# Patient Record
Sex: Female | Born: 1955 | Race: White | Hispanic: No | Smoking: Former smoker
Health system: Southern US, Community
[De-identification: ages and names within clinical notes are randomized; demographics above are authoritative.]

## PROBLEM LIST (undated history)

## (undated) DIAGNOSIS — I2510 Atherosclerotic heart disease of native coronary artery without angina pectoris: Secondary | ICD-10-CM

## (undated) DIAGNOSIS — E785 Hyperlipidemia, unspecified: Secondary | ICD-10-CM

## (undated) DIAGNOSIS — K219 Gastro-esophageal reflux disease without esophagitis: Secondary | ICD-10-CM

## (undated) DIAGNOSIS — E669 Obesity, unspecified: Secondary | ICD-10-CM

## (undated) DIAGNOSIS — I10 Essential (primary) hypertension: Secondary | ICD-10-CM

## (undated) DIAGNOSIS — I219 Acute myocardial infarction, unspecified: Secondary | ICD-10-CM

## (undated) HISTORY — DX: Gastro-esophageal reflux disease without esophagitis: K21.9

## (undated) HISTORY — DX: Obesity, unspecified: E66.9

## (undated) HISTORY — DX: Atherosclerotic heart disease of native coronary artery without angina pectoris: I25.10

## (undated) HISTORY — DX: Essential (primary) hypertension: I10

## (undated) HISTORY — DX: Hyperlipidemia, unspecified: E78.5

## (undated) HISTORY — PX: FRACTURE SURGERY: SHX138

## (undated) HISTORY — DX: Acute myocardial infarction, unspecified: I21.9

---

## 2002-04-13 ENCOUNTER — Emergency Department (HOSPITAL_COMMUNITY): Admission: EM | Admit: 2002-04-13 | Discharge: 2002-04-14 | Payer: Self-pay | Admitting: *Deleted

## 2002-05-01 ENCOUNTER — Ambulatory Visit (HOSPITAL_COMMUNITY): Admission: RE | Admit: 2002-05-01 | Discharge: 2002-05-01 | Payer: Self-pay | Admitting: Orthopaedic Surgery

## 2002-05-01 ENCOUNTER — Encounter: Payer: Self-pay | Admitting: Orthopaedic Surgery

## 2003-05-19 DIAGNOSIS — I219 Acute myocardial infarction, unspecified: Secondary | ICD-10-CM

## 2003-05-19 HISTORY — PX: CARDIAC CATHETERIZATION: SHX172

## 2003-05-19 HISTORY — DX: Acute myocardial infarction, unspecified: I21.9

## 2004-05-14 ENCOUNTER — Encounter (HOSPITAL_COMMUNITY): Admission: RE | Admit: 2004-05-14 | Discharge: 2004-06-13 | Payer: Self-pay | Admitting: *Deleted

## 2004-09-26 ENCOUNTER — Ambulatory Visit (HOSPITAL_COMMUNITY): Admission: RE | Admit: 2004-09-26 | Discharge: 2004-09-26 | Payer: Self-pay | Admitting: *Deleted

## 2006-09-22 ENCOUNTER — Encounter: Admission: RE | Admit: 2006-09-22 | Discharge: 2006-09-22 | Payer: Self-pay | Admitting: Family Medicine

## 2010-02-10 ENCOUNTER — Ambulatory Visit (HOSPITAL_COMMUNITY): Admission: RE | Admit: 2010-02-10 | Discharge: 2010-02-10 | Payer: Self-pay | Admitting: Cardiology

## 2010-02-12 ENCOUNTER — Ambulatory Visit (HOSPITAL_COMMUNITY): Admission: RE | Admit: 2010-02-12 | Discharge: 2010-02-12 | Payer: Self-pay | Admitting: Cardiology

## 2010-08-27 ENCOUNTER — Ambulatory Visit (HOSPITAL_COMMUNITY)
Admission: RE | Admit: 2010-08-27 | Discharge: 2010-08-27 | Disposition: A | Discharge status: Home / Self Care | Payer: Managed Care, Other (non HMO) | Source: Ambulatory Visit | Attending: Family Medicine | Admitting: Family Medicine

## 2010-08-27 ENCOUNTER — Other Ambulatory Visit (HOSPITAL_COMMUNITY): Payer: Self-pay | Admitting: Family Medicine

## 2010-08-27 DIAGNOSIS — M79609 Pain in unspecified limb: Secondary | ICD-10-CM | POA: Insufficient documentation

## 2010-08-27 DIAGNOSIS — M7989 Other specified soft tissue disorders: Secondary | ICD-10-CM | POA: Insufficient documentation

## 2010-08-27 DIAGNOSIS — M79605 Pain in left leg: Secondary | ICD-10-CM

## 2011-01-05 ENCOUNTER — Other Ambulatory Visit: Payer: Self-pay | Admitting: Family Medicine

## 2011-01-05 DIAGNOSIS — R922 Inconclusive mammogram: Secondary | ICD-10-CM

## 2011-01-12 ENCOUNTER — Other Ambulatory Visit: Payer: Managed Care, Other (non HMO)

## 2011-01-26 ENCOUNTER — Ambulatory Visit
Admission: RE | Admit: 2011-01-26 | Discharge: 2011-01-26 | Disposition: A | Discharge status: Home / Self Care | Payer: Managed Care, Other (non HMO) | Source: Ambulatory Visit | Attending: Family Medicine | Admitting: Family Medicine

## 2011-01-26 DIAGNOSIS — R922 Inconclusive mammogram: Secondary | ICD-10-CM

## 2011-02-02 ENCOUNTER — Other Ambulatory Visit: Payer: Self-pay | Admitting: Obstetrics & Gynecology

## 2011-02-02 ENCOUNTER — Other Ambulatory Visit (HOSPITAL_COMMUNITY)
Admission: RE | Admit: 2011-02-02 | Discharge: 2011-02-02 | Disposition: A | Discharge status: Home / Self Care | Payer: Managed Care, Other (non HMO) | Source: Ambulatory Visit | Attending: Obstetrics & Gynecology | Admitting: Obstetrics & Gynecology

## 2011-02-02 DIAGNOSIS — Z113 Encounter for screening for infections with a predominantly sexual mode of transmission: Secondary | ICD-10-CM | POA: Insufficient documentation

## 2011-02-02 DIAGNOSIS — Z01419 Encounter for gynecological examination (general) (routine) without abnormal findings: Secondary | ICD-10-CM | POA: Insufficient documentation

## 2012-05-23 ENCOUNTER — Telehealth: Payer: Self-pay

## 2012-05-23 NOTE — Telephone Encounter (Signed)
Gastroenterology Pre-Procedure Form  Triaged by Amanda Cockayne, CMA  Request Date: 05/20/2012      Requesting Physician: Dr. Sherwood Gambler     PATIENT INFORMATION:  Maria Robertson is a 57 y.o., female (DOB=1955/11/29).  PROCEDURE: Procedure(s) requested: colonoscopy Procedure Reason: screening for colon cancer  PATIENT REVIEW QUESTIONS: The patient reports the following:   1. Diabetes Melitis: no 2. Joint replacements in the past 12 months: no 3. Major health problems in the past 3 months: no 4. Has an artificial valve or MVP: YES 5. Has been advised in past to take antibiotics in advance of a procedure like teeth cleaning: YES    MEDICATIONS & ALLERGIES:    Patient reports the following regarding taking any blood thinners:   Plavix? yes  Aspirin?yes  Coumadin?  no  Patient confirms/reports the following medications:  Current Outpatient Prescriptions  Medication Sig Dispense Refill  . aspirin 81 MG tablet Take 81 mg by mouth daily.      Marland Kitchen atorvastatin (LIPITOR) 20 MG tablet Take 20 mg by mouth daily.      . clopidogrel (PLAVIX) 75 MG tablet Take 75 mg by mouth daily.      . nebivolol (BYSTOLIC) 5 MG tablet Take 5 mg by mouth daily.      Marland Kitchen omeprazole (PRILOSEC) 20 MG capsule Take 20 mg by mouth daily.        Patient confirms/reports the following allergies:  No Known Allergies  Patient is appropriate to schedule for requested procedure(s): yes  AUTHORIZATION INFORMATION Primary Insurance: ,  ID #: ,  Group #:  Pre-Cert / Auth required:  Secondary Insurance:   ID #:   Group #:  Pre-Cert / Auth required:  Pre-Cert / Auth #:   No orders of the defined types were placed in this encounter.    SCHEDULE INFORMATION: Procedure has been scheduled as follows:  Date:                    Time:  Location:   This Gastroenterology Pre-Precedure Form is being routed to the following provider(s) for review: Jonette Eva, MD

## 2012-05-25 NOTE — Telephone Encounter (Signed)
Called pt. She is not feeling well today. Still wants to call her insurance co first and she will call back to schedule.

## 2012-05-25 NOTE — Telephone Encounter (Signed)
MOVI PREP SPLIT DOSING, REGULAR BREAKFAST. CLEAR LIQUIDS AFTER 9 AM.  

## 2012-05-31 NOTE — Telephone Encounter (Signed)
LMOM to call. Mailing a letter also to call.  

## 2012-09-23 ENCOUNTER — Encounter: Payer: Self-pay | Admitting: Internal Medicine

## 2013-02-02 ENCOUNTER — Other Ambulatory Visit: Payer: Self-pay | Admitting: Internal Medicine

## 2013-02-03 NOTE — Telephone Encounter (Signed)
Rx was sent to pharmacy electronically. 

## 2013-03-27 ENCOUNTER — Other Ambulatory Visit: Payer: Self-pay | Admitting: *Deleted

## 2013-03-30 ENCOUNTER — Ambulatory Visit (INDEPENDENT_AMBULATORY_CARE_PROVIDER_SITE_OTHER): Payer: Managed Care, Other (non HMO) | Admitting: Internal Medicine

## 2013-03-30 ENCOUNTER — Encounter: Payer: Self-pay | Admitting: Internal Medicine

## 2013-03-30 VITALS — BP 120/70 | HR 63 | Ht 59.0 in | Wt 174.4 lb

## 2013-03-30 DIAGNOSIS — E785 Hyperlipidemia, unspecified: Secondary | ICD-10-CM

## 2013-03-30 DIAGNOSIS — K219 Gastro-esophageal reflux disease without esophagitis: Secondary | ICD-10-CM

## 2013-03-30 DIAGNOSIS — I2510 Atherosclerotic heart disease of native coronary artery without angina pectoris: Secondary | ICD-10-CM

## 2013-03-30 DIAGNOSIS — E669 Obesity, unspecified: Secondary | ICD-10-CM

## 2013-03-30 MED ORDER — CLOPIDOGREL BISULFATE 75 MG PO TABS: 75.0000 mg | ORAL_TABLET | Freq: Every day | ORAL | Status: DC

## 2013-03-30 MED ORDER — ATORVASTATIN CALCIUM 20 MG PO TABS: 20.0000 mg | ORAL_TABLET | Freq: Every day | ORAL | Status: DC

## 2013-03-30 MED ORDER — NEBIVOLOL HCL 10 MG PO TABS: 10.0000 mg | ORAL_TABLET | Freq: Every day | ORAL | Status: DC

## 2013-03-30 MED ORDER — NITROGLYCERIN 0.4 MG SL SUBL: 0.4000 mg | SUBLINGUAL_TABLET | SUBLINGUAL | Status: DC | PRN

## 2013-03-30 NOTE — Patient Instructions (Addendum)
Please have blood work done today. We will call you with the results.  Your physician wants you to follow-up in: 1 year. You will receive a reminder letter in the mail two months in advance. If you don't receive a letter, please call our office to schedule the follow-up appointment.

## 2013-03-31 ENCOUNTER — Encounter: Payer: Self-pay | Admitting: Internal Medicine

## 2013-03-31 DIAGNOSIS — K219 Gastro-esophageal reflux disease without esophagitis: Secondary | ICD-10-CM | POA: Insufficient documentation

## 2013-03-31 DIAGNOSIS — E669 Obesity, unspecified: Secondary | ICD-10-CM | POA: Insufficient documentation

## 2013-03-31 DIAGNOSIS — E785 Hyperlipidemia, unspecified: Secondary | ICD-10-CM | POA: Insufficient documentation

## 2013-03-31 DIAGNOSIS — I2510 Atherosclerotic heart disease of native coronary artery without angina pectoris: Secondary | ICD-10-CM | POA: Insufficient documentation

## 2013-03-31 LAB — NMR LIPOPROFILE WITH LIPIDS
HDL Particle Number: 43 umol/L (ref >=30.5)
HDL Size: 9.3 nm (ref >=9.2)
HDL-C: 66 mg/dL (ref >=40)
LDL Size: 21 nm (ref >20.5)
Large HDL-P: 9.9 umol/L (ref >=4.8)
Large VLDL-P: 4.9 nmol/L — ABNORMAL HIGH (ref <=2.7)
Small LDL Particle Number: 330 nmol/L (ref <=527)

## 2013-03-31 NOTE — Progress Notes (Signed)
OFFICE NOTE  Chief Complaint:  Routine follow-up  Primary Care Physician: Kirk Ruths, MD  HPI:  Maria Robertson is a 57 year old female who was previously followed by Dr. Lynnea Robertson with a history of myocardial infarction and a stent to the left circumflex in 10/25/03 which was a 2.5 x 8 mm Cypher stent. She underwent cardiac catheterization again in 10/24/2009 after the death of her husband who died suddenly of heart disease, and she was having chest pain at that time. This demonstrated no other significant coronary disease, and the stent to the circumflex was patent. She also has dyslipidemia, reflux, and obesity. She has no specific complaints today. She's any refills of her medications. She has again found work at a [employer], [employer]. She has had some weight gain and realizes that she needs to start to get more exercise.  PMHx:  Past Medical History  Diagnosis Date  . Myocardial infarction 10-25-03  . Coronary artery disease   . Dyslipidemia   . GERD (gastroesophageal reflux disease)     Past Surgical History  Procedure Laterality Date  . Cardiac catheterization  25-Oct-2003    showed 80% stenosis in left circumflex, 2.5x9mm Taxus drug-eluting stent   . Fracture surgery      right foot    FAMHx:  Family History  Problem Relation Age of Onset  . Heart disease Father   . Diabetes Mother   . Hypertension Mother     SOCHx:   reports that she quit smoking about 3 years ago. Her smoking use included Cigarettes. She smoked 0.00 packs per day for 20 years. She has never used smokeless tobacco. She reports that she does not drink alcohol or use illicit drugs.  ALLERGIES:  No Known Allergies  ROS: A comprehensive review of systems was negative except for: Constitutional: positive for weight gain  HOME MEDS: Current Outpatient Prescriptions  Medication Sig Dispense Refill  . aspirin 81 MG tablet Take 81 mg by mouth daily.      Marland Kitchen atorvastatin (LIPITOR) 20 MG tablet  Take 1 tablet (20 mg total) by mouth at bedtime.  90 tablet  3  . clopidogrel (PLAVIX) 75 MG tablet Take 1 tablet (75 mg total) by mouth daily.  90 tablet  3  . nebivolol (BYSTOLIC) 10 MG tablet Take 1 tablet (10 mg total) by mouth daily.  90 tablet  3  . nitroGLYCERIN (NITROSTAT) 0.4 MG SL tablet Place 1 tablet (0.4 mg total) under the tongue every 5 (five) minutes as needed for chest pain.  25 tablet  3  . omeprazole (PRILOSEC) 20 MG capsule Take 20 mg by mouth daily.       No current facility-administered medications for this visit.    LABS/IMAGING: No results found for this or any previous visit (from the past 48 hour(s)). No results found.  VITALS: BP 120/70  Pulse 63  Ht 4\' 11"  (1.499 m)  Wt 174 lb 6.4 oz (79.107 kg)  BMI 35.21 kg/m2  EXAM: General appearance: alert and no distress Neck: no carotid bruit and no JVD Lungs: clear to auscultation bilaterally Heart: regular rate and rhythm, S1, S2 normal, no murmur, click, rub or gallop Abdomen: soft, non-tender; bowel sounds normal; no masses,  no organomegaly Extremities: extremities normal, atraumatic, no cyanosis or edema Pulses: 2+ and symmetric Skin: Skin color, texture, turgor normal. No rashes or lesions Neurologic: Grossly normal Psych: Mood, affect normal  EKG: Normal sinus rhythm at 63  ASSESSMENT: 1. Coronary artery disease  status post PCI to left circumflex in 2005 2. Dyslipidemia - due for testing 3. GERD 4. Obesity  PLAN: 1.   Maria Robertson has had about a 15 pound weight gain since her last office visit. She knows she needs to work on weight loss and exercise. Dietary changes would be important as well. She's had no chest pain and is able to do most activities without any impairment. She is due for repeat cholesterol testing and will order that today. Plan to see her back annually or sooner as necessary and I will adjust her medications as needed.  Chrystie Nose, MD, [facility] Attending Cardiologist [facility]  Maria Robertson C 03/31/2013, 10:59 AM

## 2013-04-07 ENCOUNTER — Encounter: Payer: Self-pay | Admitting: *Deleted

## 2014-03-28 ENCOUNTER — Telehealth: Payer: Self-pay | Admitting: Internal Medicine

## 2014-03-30 NOTE — Telephone Encounter (Signed)
Close encounter 

## 2014-04-24 ENCOUNTER — Encounter: Payer: Self-pay | Admitting: Internal Medicine

## 2014-04-24 ENCOUNTER — Ambulatory Visit (INDEPENDENT_AMBULATORY_CARE_PROVIDER_SITE_OTHER): Payer: Medicare HMO | Admitting: Internal Medicine

## 2014-04-24 VITALS — BP 142/84 | HR 74 | Ht 59.0 in | Wt 182.8 lb

## 2014-04-24 DIAGNOSIS — I2510 Atherosclerotic heart disease of native coronary artery without angina pectoris: Secondary | ICD-10-CM

## 2014-04-24 DIAGNOSIS — E669 Obesity, unspecified: Secondary | ICD-10-CM

## 2014-04-24 DIAGNOSIS — I208 Other forms of angina pectoris: Secondary | ICD-10-CM

## 2014-04-24 DIAGNOSIS — K219 Gastro-esophageal reflux disease without esophagitis: Secondary | ICD-10-CM

## 2014-04-24 DIAGNOSIS — E785 Hyperlipidemia, unspecified: Secondary | ICD-10-CM

## 2014-04-24 DIAGNOSIS — I2089 Other forms of angina pectoris: Secondary | ICD-10-CM

## 2014-04-24 DIAGNOSIS — I2583 Coronary atherosclerosis due to lipid rich plaque: Secondary | ICD-10-CM

## 2014-04-24 NOTE — Patient Instructions (Signed)
Your physician has requested that you have an exercise tolerance test. For further information please visit [email or web address]. Please also follow instruction sheet, as given.  Your physician wants you to follow-up in: 1 year with Dr. Hilty. You will receive a reminder letter in the mail two months in advance. If you don't receive a letter, please call our office to schedule the follow-up appointment.  

## 2014-04-24 NOTE — Progress Notes (Signed)
OFFICE NOTE  Chief Complaint:  Routine follow-up  Primary Care Physician: Kirk RuthsMCGOUGH,WILLIAM M, MD  HPI:  Maria Robertson is a 58 year old female who was previously followed by Dr. Lynnea FerrierSolomon with a history of myocardial infarction and a stent to the left circumflex in 2005 which was a 2.5 x 8 mm Cypher stent. She underwent cardiac catheterization again in 2011 after the death of her husband who died suddenly of heart disease, and she was having chest pain at that time. This demonstrated no other significant coronary disease, and the stent to the circumflex was patent. She also has dyslipidemia, reflux, and obesity. She has no specific complaints today. She's any refills of her medications. 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 She has had some weight gain and realizes that she needs to start to get more exercise.  Maria Robertson returns for follow-up today. [place]. She denies any chest pain or worsening shortness of breath. Blood pressures been running a little bit higher than usual. She has unfortunate gained another 5 pounds which makes about 20 pound weight gain over the last year. She realizes this and is going to get started on weight loss. She's had her cholesterol tested through her primary care physician and is told that it is within normal limits. Finally when pressed about physical activity she reports she does have a little bit of chest pressure with marked activity, especially when walking up hills. She says this limits her activity to some extent and she discontinues her walking. This pressure does not radiate to her neck or arm as it did previously prior to her heart attack but is somewhat reminiscent of those symptoms. She is interested in starting a more intense exercise program.  PMHx:  Past Medical History  Diagnosis Date  . Myocardial infarction 2005  . Coronary artery disease   . Dyslipidemia   . GERD  (gastroesophageal reflux disease)     Past Surgical History  Procedure Laterality Date  . Cardiac catheterization  2005    showed 80% stenosis in left circumflex, 2.5x628mm Taxus drug-eluting stent   . Fracture surgery      right foot    FAMHx:  Family History  Problem Relation Age of Onset  . Heart disease Father   . Diabetes Mother   . Hypertension Mother     SOCHx:   reports that she quit smoking about 4 years ago. Her smoking use included Cigarettes. She smoked 0.00 packs per day for 20 years. She has never used smokeless tobacco. She reports that she does not drink alcohol or use illicit drugs.  ALLERGIES:  No Known Allergies  ROS: A comprehensive review of systems was negative except for: Constitutional: positive for weight gain  HOME MEDS: Current Outpatient Prescriptions  Medication Sig Dispense Refill  . aspirin 81 MG tablet Take 81 mg by mouth daily.    Marland Kitchen. atorvastatin (LIPITOR) 20 MG tablet Take 1 tablet (20 mg total) by mouth at bedtime. 90 tablet 3  . clopidogrel (PLAVIX) 75 MG tablet Take 1 tablet (75 mg total) by mouth daily. 90 tablet 3  . nebivolol (BYSTOLIC) 10 MG tablet Take 1 tablet (10 mg total) by mouth daily. 90 tablet 3  . nitroGLYCERIN (NITROSTAT) 0.4 MG SL tablet Place 1 tablet (0.4 mg total) under the tongue every 5 (five) minutes as needed for chest pain. 25 tablet 3  . omeprazole (PRILOSEC) 20 MG capsule Take 20 mg  by mouth daily.     No current facility-administered medications for this visit.    LABS/IMAGING: No results found for this or any previous visit (from the past 48 hour(s)). No results found.  VITALS: BP 142/84 mmHg  Pulse 74  Ht 4\' 11"  (1.499 m)  Wt 182 lb 12.8 oz (82.918 kg)  BMI 36.90 kg/m2  EXAM: General appearance: alert and no distress Neck: no carotid bruit and no JVD Lungs: clear to auscultation bilaterally Heart: regular rate and rhythm, S1, S2 normal, no murmur, click, rub or gallop Abdomen: soft, non-tender;  bowel sounds normal; no masses,  no organomegaly Extremities: extremities normal, atraumatic, no cyanosis or edema Pulses: 2+ and symmetric Skin: Skin color, texture, turgor normal. No rashes or lesions Neurologic: Grossly normal Psych: Mood, affect normal  EKG: Normal sinus rhythm at 74  ASSESSMENT: 1. Coronary artery disease status post PCI to left circumflex in 2005 2. Dyslipidemia - due for testing 3. GERD 4. Obesity 5. Exertional chest pressure  PLAN: 1.   Maria Robertson has had another 5 pound weight gain. This may be contributing to her shortness of breath, but she also is describing some exertional chest pressure. It seems to be when walking up hills or doing significant exertion which she discontinues and her symptoms improved. This could represent angina. Her last stress test was many years ago and she had a cardiac cardiac catheterization 2011 which did not show any acute obstruction. I would recommend an exercise treadmill stress test to evaluate for her symptoms and for angina. If this is negative, would be helpful and prognostic as well as allow her to start a more vigorous exercise program. We will contact her with results of that treadmill stress test and plan to see her back annually or sooner if it is abnormal.  Chrystie NoseKenneth C. Hilty, MD, [facility] Attending Cardiologist [facility]  HILTY,Kenneth C 04/24/2014, 4:06 PM

## 2014-05-01 ENCOUNTER — Telehealth (HOSPITAL_COMMUNITY): Payer: Self-pay

## 2014-05-01 NOTE — Telephone Encounter (Signed)
Encounter complete. 

## 2014-05-03 ENCOUNTER — Encounter (HOSPITAL_COMMUNITY): Payer: Medicare HMO

## 2014-05-03 ENCOUNTER — Other Ambulatory Visit: Payer: Self-pay | Admitting: Internal Medicine

## 2014-05-03 NOTE — Telephone Encounter (Signed)
Rx was sent to pharmacy electronically. 

## 2014-05-03 NOTE — Telephone Encounter (Signed)
Encounter complete. 

## 2014-05-07 ENCOUNTER — Other Ambulatory Visit: Payer: Self-pay | Admitting: Internal Medicine

## 2014-05-07 NOTE — Telephone Encounter (Signed)
Rx(s) sent to pharmacy electronically.  

## 2014-05-29 ENCOUNTER — Telehealth (HOSPITAL_COMMUNITY): Payer: Self-pay

## 2014-05-29 NOTE — Telephone Encounter (Signed)
Encounter complete. 

## 2014-05-30 ENCOUNTER — Telehealth (HOSPITAL_COMMUNITY): Payer: Self-pay

## 2014-05-30 NOTE — Telephone Encounter (Signed)
Encounter complete. 

## 2014-05-31 ENCOUNTER — Ambulatory Visit (HOSPITAL_COMMUNITY)
Admission: RE | Admit: 2014-05-31 | Discharge: 2014-05-31 | Disposition: A | Discharge status: Home / Self Care | Payer: Medicare HMO | Source: Ambulatory Visit | Attending: Internal Medicine | Admitting: Internal Medicine

## 2014-05-31 DIAGNOSIS — I208 Other forms of angina pectoris: Secondary | ICD-10-CM

## 2014-05-31 DIAGNOSIS — R079 Chest pain, unspecified: Secondary | ICD-10-CM | POA: Diagnosis not present

## 2014-05-31 NOTE — Procedures (Signed)
Exercise Treadmill Test   Test  Exercise Tolerance Test Ordering MD: Chrystie NoseKenneth C. Cloteal Isaacson, MD    Unique Test No: 1  Treadmill:  1  Indication for ETT: exertional dyspnea  Contraindication to ETT: No   Stress Modality: exercise - treadmill  Cardiac Imaging Performed: non   Protocol: standard Bruce - maximal  Max BP:  149/69  Max MPHR (bpm):  162 85% MPR (bpm):  138  MPHR obtained (bpm):  144 % MPHR obtained:  88  Reached 85% MPHR (min:sec):   Total Exercise Time (min-sec):  7  Workload in METS:  8.5 Borg Scale: 15  Reason ETT Terminated:  dyspnea    ST Segment Analysis At Rest: normal ST segments - no evidence of significant ST depression With Exercise: insignificant upsloping ST depression  Other Information Arrhythmia:  No Angina during ETT:  absent (0) Quality of ETT:  diagnostic  ETT Interpretation:  normal - no evidence of ischemia by ST analysis  Comments: <551mm upsloping ST depression inferiorly Fair exercise tolerance Normal BP response No chest pain, but significant dyspnea  Chrystie NoseKenneth C. Manila Rommel, MD, [facility] Attending Cardiologist [facility]

## 2015-06-14 ENCOUNTER — Other Ambulatory Visit: Payer: Self-pay | Admitting: Internal Medicine

## 2015-06-18 ENCOUNTER — Telehealth: Payer: Self-pay | Admitting: Internal Medicine

## 2015-06-18 NOTE — Telephone Encounter (Signed)
°*  STAT* If patient is at the pharmacy, call can be transferred to refill team.   1. Which medications need to be refilled? (please list name of each medication and dose if known) All meds   2. Which pharmacy/location (including street and city if local pharmacy) is medication to be sent to?[facility] on [address]   3. Do they need a 30 day or 90 day supply? 90

## 2015-06-19 MED ORDER — NEBIVOLOL HCL 10 MG PO TABS: 10.0000 mg | ORAL_TABLET | Freq: Every day | ORAL | Status: DC

## 2015-06-19 MED ORDER — CLOPIDOGREL BISULFATE 75 MG PO TABS: 75.0000 mg | ORAL_TABLET | Freq: Every day | ORAL | Status: DC

## 2015-06-19 MED ORDER — ATORVASTATIN CALCIUM 20 MG PO TABS: 20.0000 mg | ORAL_TABLET | Freq: Every day | ORAL | Status: DC

## 2015-06-19 NOTE — Telephone Encounter (Signed)
Rx(s) sent to pharmacy electronically.  

## 2015-07-01 ENCOUNTER — Encounter: Payer: Self-pay | Admitting: Internal Medicine

## 2015-07-01 ENCOUNTER — Ambulatory Visit (INDEPENDENT_AMBULATORY_CARE_PROVIDER_SITE_OTHER): Payer: 59 | Admitting: Internal Medicine

## 2015-07-01 VITALS — BP 120/74 | HR 56 | Ht <= 58 in | Wt 183.1 lb

## 2015-07-01 DIAGNOSIS — I2510 Atherosclerotic heart disease of native coronary artery without angina pectoris: Secondary | ICD-10-CM | POA: Diagnosis not present

## 2015-07-01 DIAGNOSIS — K219 Gastro-esophageal reflux disease without esophagitis: Secondary | ICD-10-CM | POA: Diagnosis not present

## 2015-07-01 DIAGNOSIS — E669 Obesity, unspecified: Secondary | ICD-10-CM | POA: Diagnosis not present

## 2015-07-01 DIAGNOSIS — E785 Hyperlipidemia, unspecified: Secondary | ICD-10-CM

## 2015-07-01 DIAGNOSIS — I2583 Coronary atherosclerosis due to lipid rich plaque: Principal | ICD-10-CM

## 2015-07-01 MED ORDER — CLOPIDOGREL BISULFATE 75 MG PO TABS: 75.0000 mg | ORAL_TABLET | Freq: Every day | ORAL | Status: DC

## 2015-07-01 MED ORDER — NEBIVOLOL HCL 10 MG PO TABS: 10.0000 mg | ORAL_TABLET | Freq: Every day | ORAL | Status: DC

## 2015-07-01 MED ORDER — ATORVASTATIN CALCIUM 20 MG PO TABS: 20.0000 mg | ORAL_TABLET | Freq: Every day | ORAL | Status: DC

## 2015-07-01 NOTE — Progress Notes (Signed)
OFFICE NOTE  Chief Complaint:  Routine follow-up  Primary Care Physician: Maria Ruths, MD  HPI:  Maria Robertson is a 60 year old female who was previously followed by Dr. Lynnea Robertson with a history of myocardial infarction and a stent to the left circumflex in 09-25-2003 which was a 2.5 x 8 mm Cypher stent. She underwent cardiac catheterization again in 2009-09-24 after the death of her husband who died suddenly of heart disease, and she was having chest pain at that time. This demonstrated no other significant coronary disease, and the stent to the circumflex was patent. She also has dyslipidemia, reflux, and obesity. She has no specific complaints today. She's any refills of her medications. She has again found work at a [employer], [employer]. She has had some weight gain and realizes that she needs to start to get more exercise.  Maria Robertson returns for follow-up today. Recently she was on vacation in [place]. She denies any chest pain or worsening shortness of breath. Blood pressures been running a little bit higher than usual. She has unfortunate gained another 5 pounds which makes about 20 pound weight gain over the last year. She realizes this and is going to get started on weight loss. She's had her cholesterol tested through her primary care physician and is told that it is within normal limits. Finally when pressed about physical activity she reports she does have a little bit of chest pressure with marked activity, especially when walking up hills. She says this limits her activity to some extent and she discontinues her walking. This pressure does not radiate to her neck or arm as it did previously prior to her heart attack but is somewhat reminiscent of those symptoms. She is interested in starting a more intense exercise program.  Maria Robertson returns today for follow-up. She is asymptomatic and denies any chest pain or worsening shortness breath. Her blood pressure is  actually well controlled today. She says the cost of her nebivolol has gone up significantly. We'll try to provide her with the co-pay assistance card and samples today. She is on cholesterol medicine and recently had labs checked by her primary care provider. We'll need to get those results. She denies a significant bleeding problems on aspirin and Plavix. She's not needed any nitroglycerin. She denies any chest pain. EKG is stable.  PMHx:  Past Medical History  Diagnosis Date  . Myocardial infarction (HCC) 09/25/03  . Coronary artery disease   . Dyslipidemia   . GERD (gastroesophageal reflux disease)     Past Surgical History  Procedure Laterality Date  . Cardiac catheterization  09/25/2003    showed 80% stenosis in left circumflex, 2.5x80mm Taxus drug-eluting stent   . Fracture surgery      right foot    FAMHx:  Family History  Problem Relation Age of Onset  . Heart disease Father   . Diabetes Mother   . Hypertension Mother     SOCHx:   reports that she quit smoking about 5 years ago. Her smoking use included Cigarettes. She quit after 20 years of use. She has never used smokeless tobacco. She reports that she does not drink alcohol or use illicit drugs.  ALLERGIES:  No Known Allergies  ROS: Pertinent items noted in HPI and remainder of comprehensive ROS otherwise negative.  HOME MEDS: Current Outpatient Prescriptions  Medication Sig Dispense Refill  . aspirin 81 MG tablet Take 81 mg by mouth daily.    Marland Kitchen atorvastatin (LIPITOR) 20  MG tablet Take 1 tablet (20 mg total) by mouth at bedtime. 90 tablet 3  . clopidogrel (PLAVIX) 75 MG tablet Take 1 tablet (75 mg total) by mouth daily. 90 tablet 3  . nebivolol (BYSTOLIC) 10 MG tablet Take 1 tablet (10 mg total) by mouth daily. 90 tablet 3  . NITROSTAT 0.4 MG SL tablet PLACE 1 TABLET (0.4 MG TOTAL) UNDER THE TONGUE EVERY 5 (FIVE) MINUTES AS NEEDED FOR CHEST PAIN. 25 tablet 1  . omeprazole (PRILOSEC) 20 MG capsule Take 20 mg by mouth  daily.     No current facility-administered medications for this visit.    LABS/IMAGING: No results found for this or any previous visit (from the past 48 hour(s)). No results found.  VITALS: BP 120/74 mmHg  Pulse 56  Ht  (1.473 m)  Wt 183 lb 1.6 oz (83.054 kg)  BMI 38.28 kg/m2  EXAM: General appearance: alert and no distress Neck: no carotid bruit and no JVD Lungs: clear to auscultation bilaterally Heart: regular rate and rhythm, S1, S2 normal, no murmur, click, rub or gallop Abdomen: soft, non-tender; bowel sounds normal; no masses,  no organomegaly Extremities: extremities normal, atraumatic, no cyanosis or edema Pulses: 2+ and symmetric Skin: Skin color, texture, turgor normal. No rashes or lesions Neurologic: Grossly normal Psych: Mood, affect normal  EKG: Sinus bradycardia 56  ASSESSMENT: 1. Coronary artery disease status post PCI to left circumflex in 2005 2. Dyslipidemia 3. GERD 4. Obesity  PLAN: 1.   Maria Robertson seems stable on her current regimen. Her blood pressure is better today. She needs refills of nebivolol and hopefully a co-pay card. Cholesterol is reportedly stable and will obtain those results. She is on Lipitor 20. She is on dual antiplatelet therapy which was started prior to me assuming her care. She seems to have done well with this long-term has not had bleeding problems. We'll continue this combination. Plan to see her back annually or sooner as necessary.  Maria Nose, MD, [facility] Attending Cardiologist [facility]  Maria Robertson 07/01/2015, 5:15 PM

## 2015-07-01 NOTE — Patient Instructions (Addendum)
Your physician wants you to follow-up in: 1 year with Dr. Rennis Golden. You will receive a reminder letter in the mail two months in advance. If you don't receive a letter, please call our office to schedule the follow-up appointment.  bystolic samples - 2 boxes given to patient + copay card  Call to PCP office - LM to get labs

## 2015-08-15 ENCOUNTER — Other Ambulatory Visit: Payer: Self-pay | Admitting: Family Medicine

## 2015-08-15 DIAGNOSIS — Z1231 Encounter for screening mammogram for malignant neoplasm of breast: Secondary | ICD-10-CM

## 2015-10-25 ENCOUNTER — Ambulatory Visit
Admission: RE | Admit: 2015-10-25 | Discharge: 2015-10-25 | Disposition: A | Discharge status: Home / Self Care | Payer: 59 | Source: Ambulatory Visit | Attending: Family Medicine | Admitting: Family Medicine

## 2015-10-25 DIAGNOSIS — Z1231 Encounter for screening mammogram for malignant neoplasm of breast: Secondary | ICD-10-CM

## 2016-05-28 DIAGNOSIS — S8982XA Other specified injuries of left lower leg, initial encounter: Secondary | ICD-10-CM | POA: Diagnosis not present

## 2016-05-28 DIAGNOSIS — M7052 Other bursitis of knee, left knee: Secondary | ICD-10-CM | POA: Diagnosis not present

## 2016-05-28 DIAGNOSIS — M1712 Unilateral primary osteoarthritis, left knee: Secondary | ICD-10-CM | POA: Diagnosis not present

## 2016-06-05 DIAGNOSIS — S8982XA Other specified injuries of left lower leg, initial encounter: Secondary | ICD-10-CM | POA: Diagnosis not present

## 2016-06-10 DIAGNOSIS — M84362A Stress fracture, left tibia, initial encounter for fracture: Secondary | ICD-10-CM | POA: Diagnosis not present

## 2016-06-10 DIAGNOSIS — S83242A Other tear of medial meniscus, current injury, left knee, initial encounter: Secondary | ICD-10-CM | POA: Diagnosis not present

## 2016-06-17 DIAGNOSIS — M84362D Stress fracture, left tibia, subsequent encounter for fracture with routine healing: Secondary | ICD-10-CM | POA: Diagnosis not present

## 2016-07-19 DIAGNOSIS — T148XXA Other injury of unspecified body region, initial encounter: Secondary | ICD-10-CM | POA: Diagnosis not present

## 2016-07-19 DIAGNOSIS — M2550 Pain in unspecified joint: Secondary | ICD-10-CM | POA: Diagnosis not present

## 2016-08-05 DIAGNOSIS — M1712 Unilateral primary osteoarthritis, left knee: Secondary | ICD-10-CM | POA: Diagnosis not present

## 2016-08-05 DIAGNOSIS — S83242D Other tear of medial meniscus, current injury, left knee, subsequent encounter: Secondary | ICD-10-CM | POA: Diagnosis not present

## 2016-08-26 ENCOUNTER — Other Ambulatory Visit: Payer: Self-pay | Admitting: Internal Medicine

## 2016-08-26 NOTE — Telephone Encounter (Signed)
Rx(s) sent to pharmacy electronically.  

## 2016-09-01 ENCOUNTER — Other Ambulatory Visit: Payer: Self-pay | Admitting: Internal Medicine

## 2016-09-16 ENCOUNTER — Ambulatory Visit (INDEPENDENT_AMBULATORY_CARE_PROVIDER_SITE_OTHER): Payer: 59 | Admitting: Nurse Practitioner

## 2016-09-16 ENCOUNTER — Other Ambulatory Visit: Payer: Self-pay | Admitting: Nurse Practitioner

## 2016-09-16 ENCOUNTER — Encounter: Payer: Self-pay | Admitting: Nurse Practitioner

## 2016-09-16 VITALS — BP 142/84 | HR 67 | Ht <= 58 in | Wt 192.0 lb

## 2016-09-16 DIAGNOSIS — Z79899 Other long term (current) drug therapy: Secondary | ICD-10-CM | POA: Diagnosis not present

## 2016-09-16 DIAGNOSIS — M25562 Pain in left knee: Secondary | ICD-10-CM | POA: Diagnosis not present

## 2016-09-16 DIAGNOSIS — G8929 Other chronic pain: Secondary | ICD-10-CM | POA: Diagnosis not present

## 2016-09-16 DIAGNOSIS — K219 Gastro-esophageal reflux disease without esophagitis: Secondary | ICD-10-CM

## 2016-09-16 DIAGNOSIS — I10 Essential (primary) hypertension: Secondary | ICD-10-CM | POA: Diagnosis not present

## 2016-09-16 DIAGNOSIS — E785 Hyperlipidemia, unspecified: Secondary | ICD-10-CM

## 2016-09-16 DIAGNOSIS — I2583 Coronary atherosclerosis due to lipid rich plaque: Secondary | ICD-10-CM | POA: Diagnosis not present

## 2016-09-16 DIAGNOSIS — I2510 Atherosclerotic heart disease of native coronary artery without angina pectoris: Secondary | ICD-10-CM | POA: Diagnosis not present

## 2016-09-16 DIAGNOSIS — S82142D Displaced bicondylar fracture of left tibia, subsequent encounter for closed fracture with routine healing: Secondary | ICD-10-CM | POA: Diagnosis not present

## 2016-09-16 DIAGNOSIS — S83242D Other tear of medial meniscus, current injury, left knee, subsequent encounter: Secondary | ICD-10-CM | POA: Diagnosis not present

## 2016-09-16 LAB — LIPID PANEL
Cholesterol: 128 mg/dL (ref <200)
HDL: 61 mg/dL (ref >50)
LDL CALC: 52 mg/dL (ref <100)
Total CHOL/HDL Ratio: 2.1 Ratio (ref <5.0)
Triglycerides: 76 mg/dL (ref <150)
VLDL: 15 mg/dL (ref <30)

## 2016-09-16 LAB — CBC
HCT: 39.8 % (ref 35.0–45.0)
HEMOGLOBIN: 13.4 g/dL (ref 11.7–15.5)
MCH: 29.9 pg (ref 27.0–33.0)
MCHC: 33.7 g/dL (ref 32.0–36.0)
MCV: 88.8 fL (ref 80.0–100.0)
MPV: 10.9 fL (ref 7.5–12.5)
PLATELETS: 223 10*3/uL (ref 140–400)
RBC: 4.48 MIL/uL (ref 3.80–5.10)
RDW: 13.9 % (ref 11.0–15.0)
WBC: 6.6 10*3/uL (ref 3.8–10.8)

## 2016-09-16 LAB — COMPREHENSIVE METABOLIC PANEL
ALBUMIN: 4 g/dL (ref 3.6–5.1)
ALT: 19 U/L (ref 6–29)
AST: 12 U/L (ref 10–35)
Alkaline Phosphatase: 71 U/L (ref 33–130)
BILIRUBIN TOTAL: 1.1 mg/dL (ref 0.2–1.2)
BUN: 17 mg/dL (ref 7–25)
CO2: 25 mmol/L (ref 20–31)
CREATININE: 0.74 mg/dL (ref 0.50–0.99)
Calcium: 9 mg/dL (ref 8.6–10.4)
Chloride: 108 mmol/L (ref 98–110)
GLUCOSE: 97 mg/dL (ref 65–99)
Potassium: 4.5 mmol/L (ref 3.5–5.3)
SODIUM: 143 mmol/L (ref 135–146)
Total Protein: 6.6 g/dL (ref 6.1–8.1)

## 2016-09-16 MED ORDER — ATORVASTATIN CALCIUM 20 MG PO TABS: 20.0000 mg | ORAL_TABLET | Freq: Every day | ORAL | 3 refills | Status: DC

## 2016-09-16 MED ORDER — PANTOPRAZOLE SODIUM 40 MG PO TBEC: 40.0000 mg | DELAYED_RELEASE_TABLET | Freq: Every day | ORAL | 3 refills | Status: DC

## 2016-09-16 MED ORDER — CLOPIDOGREL BISULFATE 75 MG PO TABS: 75.0000 mg | ORAL_TABLET | Freq: Every day | ORAL | 3 refills | Status: DC

## 2016-09-16 MED ORDER — NEBIVOLOL HCL 10 MG PO TABS: 10.0000 mg | ORAL_TABLET | Freq: Every day | ORAL | 0 refills | Status: DC

## 2016-09-16 MED ORDER — NITROGLYCERIN 0.4 MG SL SUBL: SUBLINGUAL_TABLET | SUBLINGUAL | 2 refills | Status: DC

## 2016-09-16 MED ORDER — NEBIVOLOL HCL 10 MG PO TABS: 10.0000 mg | ORAL_TABLET | Freq: Every day | ORAL | 3 refills | Status: DC

## 2016-09-16 MED ORDER — LOSARTAN POTASSIUM 25 MG PO TABS: 25.0000 mg | ORAL_TABLET | Freq: Every day | ORAL | 3 refills | Status: DC

## 2016-09-16 NOTE — Progress Notes (Signed)
Office Visit    Patient Name: Maria Robertson Date of Encounter: 09/16/2016  Primary Care Provider:  Kirk Ruths, MD Primary Cardiologist:  C. Hilty, MD   Chief Complaint    61 year old female with a history of CAD status post left circumflex stenting in 2005, hypertension, hyperlipidemia, obesity, and GERD, who presents for annual follow-up.  Past Medical History    Past Medical History:  Diagnosis Date  . Coronary artery disease    a. 2005 MI/PCI: LCX (2.5x8 Cypher DES);  b. 2011 USA/Cath: stable anatomy & patent stent; c. 05/2014 ETT: Ex time 7 mins.  Neg study.  . Dyslipidemia   . Essential hypertension   . GERD (gastroesophageal reflux disease)   . Myocardial infarction (HCC) 2005  . Obesity    Past Surgical History:  Procedure Laterality Date  . CARDIAC CATHETERIZATION  2005   showed 80% stenosis in left circumflex, 2.5x90mm Taxus drug-eluting stent   . FRACTURE SURGERY     right foot    Allergies  No Known Allergies  History of Present Illness    61 year old female with the above past medical history including myocardial infarction in 2005 requiring drug-eluting stent placement to left circumflex. Follow-up catheterization in 2011 showed stable anatomy. She has since undergone exercise treadmill testing in January 2016, which was negative. Other history includes hypertension, hyperlipidemia, GERD, and obesity. She was last seen in early 2017 and since then has done well from a cardiac standpoint. She was exercising somewhat regularly (swimming), but about 6 weeks ago, she suffered a fracture to her tibial head on the left with damage to her meniscus when trying to lace her mother's wheelchair in her car. She has since been seen by or so and up to this point, has been managed conservatively with a brace. As result, she has not been exercising. She isn't sure if she will end up needing surgery or not. Over the past year, she has not had any chest pain, dyspnea,  PND, orthopnea, dizziness, syncope, edema, or early satiety. She has noticed that blood pressures are 140s to 150s when checked.  Home Medications    Prior to Admission medications   Medication Sig Start Date End Date Taking? Authorizing Provider  aspirin 81 MG tablet Take 81 mg by mouth daily.   Yes Historical Provider, MD  atorvastatin (LIPITOR) 20 MG tablet Take 1 tablet (20 mg total) by mouth at bedtime. MUST KEEP APPOINTMENT 09/16/16 WITH CHRIS Laynee Lockamy, NP FOR FUTURE REFILLS 08/26/16  Yes Chrystie Nose, MD  Calcium Carb-Cholecalciferol (CALCIUM 500+D PO) Take 1 tablet by mouth daily.   Yes Historical Provider, MD  clopidogrel (PLAVIX) 75 MG tablet Take 1 tablet (75 mg total) by mouth daily. MUST KEEP APPOINTMENT 09/16/16 WITH CHRIS Stepheny Canal, NP FOR FUTURE REFILLS 08/26/16  Yes Chrystie Nose, MD  Ibuprofen-Famotidine (DUEXIS PO) Take 1 tablet by mouth as needed.   Yes Historical Provider, MD  nebivolol (BYSTOLIC) 10 MG tablet Take 1 tablet (10 mg total) by mouth daily. MUST KEEP APPOINTMENT 09/16/16 WITH CHRIS Tyneshia Stivers, NP FOR FUTURE REFILLS 08/26/16  Yes Chrystie Nose, MD  NITROSTAT 0.4 MG SL tablet PLACE 1 TABLET (0.4 MG TOTAL) UNDER THE TONGUE EVERY 5 (FIVE) MINUTES AS NEEDED FOR CHEST PAIN. 05/03/14  Yes Chrystie Nose, MD    Review of Systems    Left knee pain in the setting of fracture as outlined above. Blood pressures have been running in the 140s to 150s. She otherwise denies chest  pain, dyspnea, PND, orthopnea, palpitations, dizziness, syncope, edema, or early satiety.  All other systems reviewed and are otherwise negative except as noted above.  Physical Exam    VS:  BP (!) 142/84   Pulse 67   Ht  (1.473 m)   Wt 192 lb (87.1 kg)   BMI 40.13 kg/m  , BMI Body mass index is 40.13 kg/m. GEN: Well nourished, well developed, in no acute distress.  HEENT: normal.  Neck: Supple, no JVD, carotid bruits, or masses. Cardiac: RRR, no murmurs, rubs, or gallops. No clubbing, cyanosis,  edema.  Radials/DP/PT 2+ and equal bilaterally.  Respiratory:  Respirations regular and unlabored, clear to auscultation bilaterally. GI: Soft, nontender, nondistended, BS + x 4. MS: no deformity or atrophy. Skin: warm and dry, no rash. Neuro:  Strength and sensation are intact. Psych: Normal affect.  Accessory Clinical Findings    ECG - Regular sinus rhythm, 67, no acute ST or T changes.  Assessment & Plan    1.  Coronary artery disease: Status post primary cardial infarction in 2005 requiring drug-eluting stent placement to the left circumflex. She has done well from a cardiac standpoint over the past year without any recurrence of chest pain or dyspnea. She had a negative exercise treadmill test in January 2016. She remains on aspirin, Plavix, statin, and beta blocker therapy. She inquired about the duration of Plavix therapy post MI and PCI. We discussed current guidelines. She is tolerating Plavix and prefers to remain on it. I note that she also takes Prilosec. I will switch this to Protonix given potential for interaction.  2. Essential hypertension: Blood pressure is elevated today. She says it has been running in the 140s to 150s at home. She is on diastolic 10 mg daily. I will also add losartan 25 mg daily. I'm checking a basic metabolic panel today and will plan to follow up a basic metabolic panel in 1 week. She will follow her blood pressures at home. She will contact us if systolics continue to be greater than 130. At that point, we can plan to increase losartan to 50 mg.  3. Hyperlipidemia: She has not had recent lipids or LFTs. I will check these today as she is fasting. Continue Lipitor 20 mg.  4. GERD: This has been stable. As above, switching from Prilosec to Protonix given that she is on Plavix therapy.  5. Fracture left tibial head: Recent traumatic fracture when trying to place her mother's wheelchair in her car. She also has damaged meniscus by her report. She is seeing  orthopedics today. Up to this point, this has been managed conservatively. If she were to require surgery, she would not require any additional ischemic evaluation given negative exercise treadmill test in January 2016 without symptoms since then. She would be able to come off of Plavix prior to surgery that we would request that she remain on aspirin, statin, and beta blocker throughout the perioperative period if feasible.  6. Obesity: Patient has gained some weight in the setting of inactivity over the past 6 related to knee pain and need to remain nonweightbearing. We discussed calorie counting applications for her smart phone and she just started using one. She does hope that through weight loss, she can improve her blood pressure management and potentially come off of losartan at some point in the future.  7. Disposition: Follow-up labs today. Plan to follow-up basic metabolic panel again in one week given new start of losartan. Follow-up in clinic in  1 year or sooner if necessary.  Nicolasa Ducking, NP 09/16/2016, 9:27 AM

## 2016-09-16 NOTE — Addendum Note (Signed)
Addended by: Sandi Mariscal on: 09/16/2016 09:47 AM   Modules accepted: Orders

## 2016-09-16 NOTE — Patient Instructions (Signed)
Medication Instructions: Please start Losartan 25 mg tablet daily Please stop taking the Prilosec. Please start taking Protonix 40 mg tablet daily  Labwork: Please have the following fasting labs done today: CBC, CMET and Lipids In one week please have the following labs done: BMET   Follow-Up: Your physician wants you to follow-up in: ONE YEAR WITH DR. HILTY. You will receive a reminder letter in the mail two months in advance. If you don't receive a letter, please call our office to schedule the follow-up appointment.   If you need a refill on your cardiac medications before your next appointment, please call your pharmacy.

## 2016-09-16 NOTE — Addendum Note (Signed)
Addended by: Sandi Mariscal on: 09/16/2016 09:49 AM   Modules accepted: Orders

## 2016-09-24 ENCOUNTER — Telehealth: Payer: Self-pay | Admitting: Internal Medicine

## 2016-09-24 NOTE — Telephone Encounter (Signed)
Lab orders are in the system and should be able to be seen for her to go to [place] [facility], pt notified. They will call if they need anything

## 2016-09-24 NOTE — Telephone Encounter (Signed)
New Message   Pt calling because she wants someone to call in her lab orders to [facility] in [place] because she has a broken leg and unable to get transportation to [place]. Requests a call back either way.

## 2016-09-25 DIAGNOSIS — Z79899 Other long term (current) drug therapy: Secondary | ICD-10-CM | POA: Diagnosis not present

## 2016-09-25 DIAGNOSIS — E785 Hyperlipidemia, unspecified: Secondary | ICD-10-CM | POA: Diagnosis not present

## 2016-09-26 LAB — COMPREHENSIVE METABOLIC PANEL
A/G RATIO: 1.8 (ref 1.2–2.2)
ALT: 20 IU/L (ref 0–32)
AST: 16 IU/L (ref 0–40)
Albumin: 4.6 g/dL (ref 3.6–4.8)
Alkaline Phosphatase: 107 IU/L (ref 39–117)
BUN/Creatinine Ratio: 27 (ref 12–28)
BUN: 23 mg/dL (ref 8–27)
Bilirubin Total: 2 mg/dL — ABNORMAL HIGH (ref 0.0–1.2)
CO2: 25 mmol/L (ref 18–29)
Calcium: 9.9 mg/dL (ref 8.7–10.3)
Chloride: 100 mmol/L (ref 96–106)
Creatinine, Ser: 0.85 mg/dL (ref 0.57–1.00)
GFR calc Af Amer: 86 mL/min/{1.73_m2} (ref >59)
GFR, EST NON AFRICAN AMERICAN: 74 mL/min/{1.73_m2} (ref >59)
GLUCOSE: 103 mg/dL — AB (ref 65–99)
Globulin, Total: 2.6 g/dL (ref 1.5–4.5)
POTASSIUM: 4.3 mmol/L (ref 3.5–5.2)
Sodium: 141 mmol/L (ref 134–144)
Total Protein: 7.2 g/dL (ref 6.0–8.5)

## 2016-09-26 LAB — LIPID PANEL
CHOL/HDL RATIO: 2.4 ratio (ref 0.0–4.4)
Cholesterol, Total: 164 mg/dL (ref 100–199)
HDL: 68 mg/dL (ref >39)
LDL Calculated: 74 mg/dL (ref 0–99)
TRIGLYCERIDES: 108 mg/dL (ref 0–149)
VLDL Cholesterol Cal: 22 mg/dL (ref 5–40)

## 2016-09-26 LAB — CBC
HEMATOCRIT: 43.6 % (ref 34.0–46.6)
Hemoglobin: 15.3 g/dL (ref 11.1–15.9)
MCH: 31.1 pg (ref 26.6–33.0)
MCHC: 35.1 g/dL (ref 31.5–35.7)
MCV: 89 fL (ref 79–97)
Platelets: 256 10*3/uL (ref 150–379)
RBC: 4.92 x10E6/uL (ref 3.77–5.28)
RDW: 13.9 % (ref 12.3–15.4)
WBC: 9.1 10*3/uL (ref 3.4–10.8)

## 2016-10-28 DIAGNOSIS — M7052 Other bursitis of knee, left knee: Secondary | ICD-10-CM | POA: Diagnosis not present

## 2017-02-12 DIAGNOSIS — Z23 Encounter for immunization: Secondary | ICD-10-CM | POA: Diagnosis not present

## 2017-10-08 ENCOUNTER — Telehealth: Payer: Self-pay | Admitting: Internal Medicine

## 2017-10-08 ENCOUNTER — Other Ambulatory Visit: Payer: Self-pay | Admitting: Nurse Practitioner

## 2017-10-08 MED ORDER — NEBIVOLOL HCL 10 MG PO TABS: 10.0000 mg | ORAL_TABLET | Freq: Every day | ORAL | 0 refills | Status: DC

## 2017-10-08 NOTE — Telephone Encounter (Signed)
Rx request sent to pharmacy.  

## 2017-10-08 NOTE — Telephone Encounter (Signed)
New Message    *STAT* If patient is at the pharmacy, call can be transferred to refill team.   1. Which medications need to be refilled? (please list name of each medication and dose if known) nebivolol (BYSTOLIC) 10 MG tablet  2. Which pharmacy/location (including street and city if local pharmacy) is medication to be sent to? [id number] - [place], [place] - [address]  3. Do they need a 30 day or 90 day supply?

## 2017-10-08 NOTE — Telephone Encounter (Signed)
Received a call from [facility] at [facility].Pt at pharmacy requesting bystolic refill.Ok to refill.Advised keep appointment with Dr.Hilty as planned.

## 2017-10-11 ENCOUNTER — Other Ambulatory Visit: Payer: Self-pay | Admitting: Nurse Practitioner

## 2017-10-12 NOTE — Telephone Encounter (Signed)
Rx sent to pharmacy   

## 2017-10-26 ENCOUNTER — Other Ambulatory Visit: Payer: Self-pay | Admitting: Nurse Practitioner

## 2017-10-26 ENCOUNTER — Other Ambulatory Visit: Payer: Self-pay | Admitting: Internal Medicine

## 2017-11-26 ENCOUNTER — Other Ambulatory Visit: Payer: Self-pay

## 2017-11-26 MED ORDER — CLOPIDOGREL BISULFATE 75 MG PO TABS: 75.0000 mg | ORAL_TABLET | Freq: Every day | ORAL | 0 refills | Status: DC

## 2017-11-29 ENCOUNTER — Other Ambulatory Visit: Payer: Self-pay | Admitting: *Deleted

## 2017-11-29 MED ORDER — ATORVASTATIN CALCIUM 20 MG PO TABS: 20.0000 mg | ORAL_TABLET | Freq: Every day | ORAL | 0 refills | Status: DC

## 2017-12-06 ENCOUNTER — Other Ambulatory Visit: Payer: Self-pay

## 2017-12-06 MED ORDER — PANTOPRAZOLE SODIUM 40 MG PO TBEC: DELAYED_RELEASE_TABLET | ORAL | 0 refills | Status: DC

## 2017-12-13 ENCOUNTER — Encounter: Payer: Self-pay | Admitting: Internal Medicine

## 2017-12-13 ENCOUNTER — Ambulatory Visit: Payer: 59 | Admitting: Internal Medicine

## 2017-12-13 VITALS — BP 116/82 | HR 65 | Ht 59.0 in | Wt 194.0 lb

## 2017-12-13 DIAGNOSIS — E785 Hyperlipidemia, unspecified: Secondary | ICD-10-CM | POA: Diagnosis not present

## 2017-12-13 DIAGNOSIS — I2510 Atherosclerotic heart disease of native coronary artery without angina pectoris: Secondary | ICD-10-CM | POA: Diagnosis not present

## 2017-12-13 DIAGNOSIS — I2583 Coronary atherosclerosis due to lipid rich plaque: Secondary | ICD-10-CM

## 2017-12-13 NOTE — Progress Notes (Signed)
OFFICE NOTE  Chief Complaint:  Routine follow-up  Primary Care Physician: Karleen Hampshire, MD  HPI:  Maria Robertson is a 62 year old female who was previously followed by Dr. Lynnea Ferrier with a history of myocardial infarction and a stent to the left circumflex in 10-18-03 which was a 2.5 x 8 mm Cypher stent. She underwent cardiac catheterization again in 17-Oct-2009 after the death of her husband who died suddenly of heart disease, and she was having chest pain at that time. This demonstrated no other significant coronary disease, and the stent to the circumflex was patent. She also has dyslipidemia, reflux, and obesity. She has no specific complaints today. She's any refills of her medications. She has again found work at a [employer], [employer]. She has had some weight gain and realizes that she needs to start to get more exercise.  Maria Robertson returns for follow-up today. Recently she was on vacation in [place]. She denies any chest pain or worsening shortness of breath. Blood pressures been running a little bit higher than usual. She has unfortunate gained another 5 pounds which makes about 20 pound weight gain over the last year. She realizes this and is going to get started on weight loss. She's had her cholesterol tested through her primary care physician and is told that it is within normal limits. Finally when pressed about physical activity she reports she does have a little bit of chest pressure with marked activity, especially when walking up hills. She says this limits her activity to some extent and she discontinues her walking. This pressure does not radiate to her neck or arm as it did previously prior to her heart attack but is somewhat reminiscent of those symptoms. She is interested in starting a more intense exercise program.  Maria Robertson returns today for follow-up. She is asymptomatic and denies any chest pain or worsening shortness breath. Her blood pressure is  actually well controlled today. She says the cost of her nebivolol has gone up significantly. We'll try to provide her with the co-pay assistance card and samples today. She is on cholesterol medicine and recently had labs checked by her primary care provider. We'll need to get those results. She denies a significant bleeding problems on aspirin and Plavix. She's not needed any nitroglycerin. She denies any chest pain. EKG is stable.  12/13/2017  Maria Robertson returns today for follow-up.  Overall she remains asymptomatic.  She denies chest pain or worsening shortness of breath.  Blood pressures been well controlled.  EKG shows sinus rhythm at 65.  She is on dual antiplatelet therapy which we have addressed in the past.  She feels uncomfortable stopping the antiplatelet medications because she is done so well.  She continues to be overweight and has not made much impact on that however exercises regularly without any symptoms.  PMHx:  Past Medical History:  Diagnosis Date  . Coronary artery disease    a. 2003-10-18 MI/PCI: LCX (2.5x8 Cypher DES);  b. 10/17/2009 USA/Cath: stable anatomy & patent stent; c. 05/2014 ETT: Ex time 7 mins.  Neg study.  . Dyslipidemia   . Essential hypertension   . GERD (gastroesophageal reflux disease)   . Myocardial infarction (HCC) 18-Oct-2003  . Obesity     Past Surgical History:  Procedure Laterality Date  . CARDIAC CATHETERIZATION  2003-10-18   showed 80% stenosis in left circumflex, 2.5x37mm Taxus drug-eluting stent   . FRACTURE SURGERY     right foot    FAMHx:  Family History  Problem Relation Age of Onset  . Heart disease Father   . Diabetes Mother   . Hypertension Mother     SOCHx:   reports that she quit smoking about 7 years ago. Her smoking use included cigarettes. She quit after 20.00 years of use. She has never used smokeless tobacco. She reports that she does not drink alcohol or use drugs.  ALLERGIES:  No Known Allergies  ROS: Pertinent items noted in HPI and  remainder of comprehensive ROS otherwise negative.  HOME MEDS: Current Outpatient Medications  Medication Sig Dispense Refill  . aspirin 81 MG tablet Take 81 mg by mouth daily.    Marland Kitchen. atorvastatin (LIPITOR) 20 MG tablet Take 1 tablet (20 mg total) by mouth at bedtime. 90 tablet 0  . clopidogrel (PLAVIX) 75 MG tablet Take 1 tablet (75 mg total) by mouth daily. OFFICE VISIT NEEDED CONTACT OFFICE TO SCHEDULE APPOINTMENT 90 tablet 0  . losartan (COZAAR) 25 MG tablet Take 1 tablet (25 mg total) by mouth daily. Keep OV 60 tablet 0  . nebivolol (BYSTOLIC) 10 MG tablet Take 1 tablet (10 mg total) by mouth daily. 90 tablet 2  . nitroGLYCERIN (NITROSTAT) 0.4 MG SL tablet PLACE 1 TABLET (0.4 MG TOTAL) UNDER THE TONGUE EVERY 5 (FIVE) MINUTES AS NEEDED FOR CHEST PAIN. 25 tablet 2  . pantoprazole (PROTONIX) 40 MG tablet TAKE 1 TABLET BY MOUTH EVERY DAY *KEEPS OFFICE VISIT 90 tablet 0   No current facility-administered medications for this visit.     LABS/IMAGING: No results found for this or any previous visit (from the past 48 hour(s)). No results found.  VITALS: BP 116/82   Pulse 65   Ht 4\' 11"  (1.499 m)   Wt 194 lb (88 kg)   BMI 39.18 kg/m   EXAM: General appearance: alert and no distress Neck: no carotid bruit and no JVD Lungs: clear to auscultation bilaterally Heart: regular rate and rhythm, S1, S2 normal, no murmur, click, rub or gallop Abdomen: soft, non-tender; bowel sounds normal; no masses,  no organomegaly Extremities: extremities normal, atraumatic, no cyanosis or edema Pulses: 2+ and symmetric Skin: Skin color, texture, turgor normal. No rashes or lesions Neurologic: Grossly normal Psych: Mood, affect normal  EKG: Sinus rhythm at 65-personally reviewed  ASSESSMENT: 1. Coronary artery disease status post PCI to left circumflex in 2005 2. Dyslipidemia 3. GERD 4. Obesity  PLAN: 1.   Maria Robertson denies any recurrent chest pain or worsening shortness of breath.  Per her  choice she remains on dual antiplatelet therapy.  She is on any bleeding of any significance and therefore reasonable to continue.  She is due for repeat lipid profile she has a history of dyslipidemia but is been well controlled.  She is not managed to lose a lot of weight but is physically active and reduces her cardiovascular risk by exercising.  Follow-up with me annually or sooner as necessary.  Chrystie NoseKenneth C. Marquet Faircloth, MD, [facility]icsFACC, FACP  [place]  [facility]  Medical Director of the Advanced Lipid Disorders &  Cardiovascular Risk Reduction Clinic Diplomate of the American Board of Clinical Lipidology Attending Cardiologist  Direct Dial: [phone]  Fax: [phone]  Website:  [email or web address]   Coleman Kalas C Juanell Saffo 12/13/2017, 2:09 PM

## 2017-12-13 NOTE — Patient Instructions (Signed)
Your physician recommends that you return for lab work FASTING to check cholesterol   Your physician wants you to follow-up in: ONE YEAR with Dr. Hilty. You will receive a reminder letter in the mail two months in advance. If you don't receive a letter, please call our office to schedule the follow-up appointment.   

## 2017-12-14 LAB — LIPID PANEL
CHOLESTEROL TOTAL: 138 mg/dL (ref 100–199)
Chol/HDL Ratio: 2.5 ratio (ref 0.0–4.4)
HDL: 55 mg/dL (ref >39)
LDL CALC: 66 mg/dL (ref 0–99)
TRIGLYCERIDES: 83 mg/dL (ref 0–149)
VLDL Cholesterol Cal: 17 mg/dL (ref 5–40)

## 2018-01-24 ENCOUNTER — Other Ambulatory Visit: Payer: Self-pay | Admitting: *Deleted

## 2018-01-24 MED ORDER — LOSARTAN POTASSIUM 25 MG PO TABS: 25.0000 mg | ORAL_TABLET | Freq: Every day | ORAL | 4 refills | Status: DC

## 2018-02-28 ENCOUNTER — Other Ambulatory Visit: Payer: Self-pay

## 2018-02-28 MED ORDER — PANTOPRAZOLE SODIUM 40 MG PO TBEC: DELAYED_RELEASE_TABLET | ORAL | 0 refills | Status: DC

## 2018-03-01 ENCOUNTER — Other Ambulatory Visit: Payer: Self-pay | Admitting: Internal Medicine

## 2018-09-02 ENCOUNTER — Other Ambulatory Visit: Payer: Self-pay | Admitting: Internal Medicine

## 2018-09-02 NOTE — Telephone Encounter (Signed)
Clopidogrel refilled

## 2018-09-02 NOTE — Telephone Encounter (Signed)
Pantoprazole refilled. 

## 2018-09-25 ENCOUNTER — Other Ambulatory Visit: Payer: Self-pay | Admitting: Internal Medicine

## 2018-09-26 NOTE — Telephone Encounter (Signed)
Bystolic refilled. 

## 2018-11-24 ENCOUNTER — Other Ambulatory Visit: Payer: Self-pay | Admitting: Internal Medicine

## 2018-11-25 NOTE — Telephone Encounter (Signed)
Rx(s) sent to pharmacy electronically.  

## 2018-12-18 ENCOUNTER — Other Ambulatory Visit: Payer: Self-pay | Admitting: Internal Medicine

## 2018-12-26 ENCOUNTER — Other Ambulatory Visit: Payer: Self-pay | Admitting: Internal Medicine

## 2018-12-26 NOTE — Telephone Encounter (Signed)
Pt overdue for 12 month f/u. °Please contact pt for future appointment. °

## 2019-01-12 ENCOUNTER — Other Ambulatory Visit: Payer: Self-pay | Admitting: Internal Medicine

## 2019-01-21 ENCOUNTER — Other Ambulatory Visit: Payer: Self-pay | Admitting: Internal Medicine

## 2019-01-25 ENCOUNTER — Other Ambulatory Visit: Payer: Self-pay

## 2019-01-25 MED ORDER — PANTOPRAZOLE SODIUM 40 MG PO TBEC: 40.0000 mg | DELAYED_RELEASE_TABLET | Freq: Every day | ORAL | 0 refills | Status: DC

## 2019-02-22 ENCOUNTER — Other Ambulatory Visit: Payer: Self-pay | Admitting: Internal Medicine

## 2019-03-02 ENCOUNTER — Ambulatory Visit: Payer: Commercial Managed Care - PPO | Admitting: Internal Medicine

## 2019-03-02 ENCOUNTER — Encounter: Payer: Self-pay | Admitting: Internal Medicine

## 2019-03-02 ENCOUNTER — Other Ambulatory Visit: Payer: Self-pay

## 2019-03-02 VITALS — BP 117/73 | HR 74 | Temp 97.7°F | Ht 59.0 in | Wt 182.6 lb

## 2019-03-02 DIAGNOSIS — I2510 Atherosclerotic heart disease of native coronary artery without angina pectoris: Secondary | ICD-10-CM | POA: Diagnosis not present

## 2019-03-02 DIAGNOSIS — E785 Hyperlipidemia, unspecified: Secondary | ICD-10-CM

## 2019-03-02 DIAGNOSIS — I2583 Coronary atherosclerosis due to lipid rich plaque: Secondary | ICD-10-CM | POA: Diagnosis not present

## 2019-03-02 MED ORDER — ATORVASTATIN CALCIUM 20 MG PO TABS: ORAL_TABLET | ORAL | 3 refills | Status: DC

## 2019-03-02 MED ORDER — CLOPIDOGREL BISULFATE 75 MG PO TABS: 75.0000 mg | ORAL_TABLET | Freq: Every day | ORAL | 3 refills | Status: DC

## 2019-03-02 MED ORDER — NEBIVOLOL HCL 10 MG PO TABS: 10.0000 mg | ORAL_TABLET | Freq: Every day | ORAL | 3 refills | Status: DC

## 2019-03-02 MED ORDER — LOSARTAN POTASSIUM 25 MG PO TABS: 25.0000 mg | ORAL_TABLET | Freq: Every day | ORAL | 3 refills | Status: DC

## 2019-03-02 MED ORDER — PANTOPRAZOLE SODIUM 40 MG PO TBEC: 40.0000 mg | DELAYED_RELEASE_TABLET | Freq: Every day | ORAL | 3 refills | Status: DC

## 2019-03-02 NOTE — Progress Notes (Signed)
OFFICE NOTE  Chief Complaint:  Routine follow-up  Primary Care Physician: Karleen Hampshire, MD  HPI:  Maria Robertson is a 63 year old female who was previously followed by Dr. Lynnea Ferrier with a history of myocardial infarction and a stent to the left circumflex in 09/14/2003 which was a 2.5 x 8 mm Cypher stent. She underwent cardiac catheterization again in 2009-09-13 after the death of her husband who died suddenly of heart disease, and she was having chest pain at that time. This demonstrated no other significant coronary disease, and the stent to the circumflex was patent. She also has dyslipidemia, reflux, and obesity. She has no specific complaints today. She's any refills of her medications. She has again found work at a [employer], [employer]. She has had some weight gain and realizes that she needs to start to get more exercise.  Maria Robertson returns for follow-up today. Recently she was on vacation in [place]. She denies any chest pain or worsening shortness of breath. Blood pressures been running a little bit higher than usual. She has unfortunate gained another 5 pounds which makes about 20 pound weight gain over the last year. She realizes this and is going to get started on weight loss. She's had her cholesterol tested through her primary care physician and is told that it is within normal limits. Finally when pressed about physical activity she reports she does have a little bit of chest pressure with marked activity, especially when walking up hills. She says this limits her activity to some extent and she discontinues her walking. This pressure does not radiate to her neck or arm as it did previously prior to her heart attack but is somewhat reminiscent of those symptoms. She is interested in starting a more intense exercise program.  Maria Robertson returns today for follow-up. She is asymptomatic and denies any chest pain or worsening shortness breath. Her blood pressure is  actually well controlled today. She says the cost of her nebivolol has gone up significantly. We'll try to provide her with the co-pay assistance card and samples today. She is on cholesterol medicine and recently had labs checked by her primary care provider. We'll need to get those results. She denies a significant bleeding problems on aspirin and Plavix. She's not needed any nitroglycerin. She denies any chest pain. EKG is stable.  12/13/2017  Maria Robertson returns today for follow-up.  Overall she remains asymptomatic.  She denies chest pain or worsening shortness of breath.  Blood pressures been well controlled.  EKG shows sinus rhythm at 65.  She is on dual antiplatelet therapy which we have addressed in the past.  She feels uncomfortable stopping the antiplatelet medications because she is done so well.  She continues to be overweight and has not made much impact on that however exercises regularly without any symptoms.  03/02/2019  Maria Robertson is seen today for follow-up.  She continues to do very well.  Recently she has lost some additional weight from 194 down to 182 pounds.  She is more physically active.  She denies any chest pain or worsening shortness of breath.  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  She is overdue for a lipid profile which we will repeat.  Otherwise she needs to have refills of her medications.  PMHx:  Past Medical History:  Diagnosis Date  . Coronary artery disease    a. Sep 14, 2003 MI/PCI: LCX (2.5x8 Cypher DES);  b.  2011 USA/Cath: stable anatomy & patent stent; c. 05/2014 ETT: Ex time 7 mins.  Neg study.  . Dyslipidemia   . Essential hypertension   . GERD (gastroesophageal reflux disease)   . Myocardial infarction ([place]) 2005  . Obesity     Past Surgical History:  Procedure Laterality Date  . CARDIAC CATHETERIZATION  2005   showed 80% stenosis in left circumflex, 2.5x72mm Taxus drug-eluting stent   .  FRACTURE SURGERY     right foot    FAMHx:  Family History  Problem Relation Age of Onset  . Heart disease Father   . Diabetes Mother   . Hypertension Mother     SOCHx:   reports that she quit smoking about 9 years ago. Her smoking use included cigarettes. She quit after 20.00 years of use. She has never used smokeless tobacco. She reports that she does not drink alcohol or use drugs.  ALLERGIES:  No Known Allergies  ROS: Pertinent items noted in HPI and remainder of comprehensive ROS otherwise negative.  HOME MEDS: Current Outpatient Medications  Medication Sig Dispense Refill  . aspirin 81 MG tablet Take 81 mg by mouth daily.    Marland Kitchen atorvastatin (LIPITOR) 20 MG tablet TAKE 1 TABLET BY MOUTH EVERYDAY AT BEDTIME 90 tablet 0  . clopidogrel (PLAVIX) 75 MG tablet TAKE 1 TABLET BY MOUTH EVERY DAY 90 tablet 1  . losartan (COZAAR) 25 MG tablet Take 1 tablet (25 mg total) by mouth daily. Keep OV 180 tablet 4  . nebivolol (BYSTOLIC) 10 MG tablet Take 1 tablet (10 mg total) by mouth daily. 30 tablet 0  . nitroGLYCERIN (NITROSTAT) 0.4 MG SL tablet PLACE 1 TABLET (0.4 MG TOTAL) UNDER THE TONGUE EVERY 5 (FIVE) MINUTES AS NEEDED FOR CHEST PAIN. 25 tablet 2  . pantoprazole (PROTONIX) 40 MG tablet Take 1 tablet (40 mg total) by mouth daily. 30 tablet 0   No current facility-administered medications for this visit.     LABS/IMAGING: No results found for this or any previous visit (from the past 48 hour(s)). No results found.  VITALS: BP 117/73   Pulse 74   Temp 97.7 F (36.5 C)   Ht 4\' 11"  (1.499 m)   Wt 182 lb 9.6 oz (82.8 kg)   SpO2 94%   BMI 36.88 kg/m   EXAM: General appearance: alert and no distress Neck: no carotid bruit and no JVD Lungs: clear to auscultation bilaterally Heart: regular rate and rhythm, S1, S2 normal, no murmur, click, rub or gallop Abdomen: soft, non-tender; bowel sounds normal; no masses,  no organomegaly Extremities: extremities normal, atraumatic, no  cyanosis or edema Pulses: 2+ and symmetric Skin: Skin color, texture, turgor normal. No rashes or lesions Neurologic: Grossly normal Psych: Mood, affect normal  EKG: Normal sinus rhythm at 70-personally reviewed  ASSESSMENT: 1. Coronary artery disease status post PCI to left circumflex in 2005 2. Dyslipidemia 3. GERD 4. Obesity  PLAN: 1.   Maria Robertson has done very well without any recurrent coronary disease now 15 years after stenting to the circumflex.  We will plan a repeat of her lipids today.  We will give her 90-day supplies on her drugs as requested.  Follow-up with me annually or sooner as necessary.  Pixie Casino, MD, [facility], [place] Director of the Advanced Lipid Disorders &  Cardiovascular Risk Reduction Clinic Diplomate of the American Board of Clinical Lipidology Attending Cardiologist  Direct Dial: [phone]  Fax: [phone]  Website:  [email or web address]   Rowan Pollman C Shelvy Perazzo 03/02/2019, 2:03 PM

## 2019-03-02 NOTE — Patient Instructions (Signed)
Medication Instructions:  Your physician recommends that you continue on your current medications as directed. Please refer to the Current Medication list given to you today.  *If you need a refill on your cardiac medications before your next appointment, please call your pharmacy*  Lab Work: FASTING lab work to check cholesterol   If you have labs (blood work) drawn today and your tests are completely normal, you will receive your results only by: Marland Kitchen MyChart Message (if you have MyChart) OR . A paper copy in the mail If you have any lab test that is abnormal or we need to change your treatment, we will call you to review the results.  Follow-Up: At [facility] you and your health needs are our priority.  As part of our continuing mission to provide you with exceptional heart care, we have created designated Provider Care Teams.  These Care Teams include your primary Cardiologist (physician) and Advanced Practice Providers (APPs -  Physician Assistants and Nurse Practitioners) who all work together to provide you with the care you need, when you need it.  Your next appointment:   12 months  The format for your next appointment:   In Person  Provider:   You may see Dr. Debara Pickett or one of the following Advanced Practice Providers on your designated Care Team:    Almyra Deforest, PA-C  Fabian Sharp, [place] or   Roby Lofts, [place]   Other Instructions

## 2019-09-21 ENCOUNTER — Other Ambulatory Visit: Payer: Self-pay | Admitting: Family Medicine

## 2019-09-21 DIAGNOSIS — Z1231 Encounter for screening mammogram for malignant neoplasm of breast: Secondary | ICD-10-CM

## 2019-09-26 ENCOUNTER — Other Ambulatory Visit: Payer: Self-pay

## 2019-09-26 ENCOUNTER — Ambulatory Visit
Admission: RE | Admit: 2019-09-26 | Discharge: 2019-09-26 | Disposition: A | Discharge status: Home / Self Care | Payer: Commercial Managed Care - PPO | Source: Ambulatory Visit | Attending: Family Medicine | Admitting: Family Medicine

## 2019-09-26 DIAGNOSIS — Z1231 Encounter for screening mammogram for malignant neoplasm of breast: Secondary | ICD-10-CM

## 2020-02-01 ENCOUNTER — Other Ambulatory Visit: Payer: Self-pay | Admitting: Internal Medicine

## 2020-02-19 ENCOUNTER — Other Ambulatory Visit: Payer: Self-pay | Admitting: Internal Medicine

## 2020-02-26 ENCOUNTER — Other Ambulatory Visit: Payer: Self-pay | Admitting: Internal Medicine

## 2020-03-08 ENCOUNTER — Telehealth (INDEPENDENT_AMBULATORY_CARE_PROVIDER_SITE_OTHER): Payer: Commercial Managed Care - PPO | Admitting: Physician Assistant

## 2020-03-08 ENCOUNTER — Telehealth: Payer: Self-pay

## 2020-03-08 ENCOUNTER — Encounter: Payer: Self-pay | Admitting: Physician Assistant

## 2020-03-08 VITALS — BP 126/86 | Ht 59.0 in | Wt 190.0 lb

## 2020-03-08 DIAGNOSIS — I2510 Atherosclerotic heart disease of native coronary artery without angina pectoris: Secondary | ICD-10-CM | POA: Diagnosis not present

## 2020-03-08 DIAGNOSIS — I10 Essential (primary) hypertension: Secondary | ICD-10-CM

## 2020-03-08 DIAGNOSIS — E785 Hyperlipidemia, unspecified: Secondary | ICD-10-CM | POA: Diagnosis not present

## 2020-03-08 DIAGNOSIS — I2583 Coronary atherosclerosis due to lipid rich plaque: Secondary | ICD-10-CM

## 2020-03-08 MED ORDER — ATORVASTATIN CALCIUM 20 MG PO TABS
20.0000 mg | ORAL_TABLET | Freq: Every day | ORAL | 3 refills | Status: DC
Start: 2020-03-08 — End: 2021-04-24

## 2020-03-08 MED ORDER — CLOPIDOGREL BISULFATE 75 MG PO TABS
75.0000 mg | ORAL_TABLET | Freq: Every day | ORAL | 3 refills | Status: DC
Start: 2020-03-08 — End: 2021-04-24

## 2020-03-08 MED ORDER — PANTOPRAZOLE SODIUM 40 MG PO TBEC
40.0000 mg | DELAYED_RELEASE_TABLET | Freq: Every day | ORAL | 3 refills | Status: DC
Start: 2020-03-08 — End: 2021-04-24

## 2020-03-08 MED ORDER — NEBIVOLOL HCL 5 MG PO TABS: 10.0000 mg | ORAL_TABLET | Freq: Every day | ORAL | Status: DC

## 2020-03-08 MED ORDER — NITROGLYCERIN 0.4 MG SL SUBL: 0.4000 mg | SUBLINGUAL_TABLET | SUBLINGUAL | 3 refills | Status: AC | PRN

## 2020-03-08 MED ORDER — LOSARTAN POTASSIUM 25 MG PO TABS
25.0000 mg | ORAL_TABLET | Freq: Every day | ORAL | 3 refills | Status: DC
Start: 2020-03-08 — End: 2021-04-24

## 2020-03-08 NOTE — Telephone Encounter (Signed)
°  Patient Consent for Virtual Visit  ° ° °    ° °Maria Robertson has provided verbal consent on 03/08/2020 for a virtual visit (video or telephone). ° ° °CONSENT FOR VIRTUAL VISIT FOR:  Maria Robertson  °By participating in this virtual visit I agree to the following: ° °I hereby voluntarily request, consent and authorize [facility] and its employed or contracted physicians, physician assistants, nurse practitioners or other licensed health care professionals (the Practitioner), to provide me with telemedicine health care services (the “Services") as deemed necessary by the treating Practitioner. I acknowledge and consent to receive the Services by the Practitioner via telemedicine. I understand that the telemedicine visit will involve communicating with the Practitioner through live audiovisual communication technology and the disclosure of certain medical information by electronic transmission. I acknowledge that I have been given the opportunity to request an in-person assessment or other available alternative prior to the telemedicine visit and am voluntarily participating in the telemedicine visit. ° °I understand that I have the right to withhold or withdraw my consent to the use of telemedicine in the course of my care at any time, without affecting my right to future care or treatment, and that the Practitioner or I may terminate the telemedicine visit at any time. I understand that I have the right to inspect all information obtained and/or recorded in the course of the telemedicine visit and may receive copies of available information for a reasonable fee.  I understand that some of the potential risks of receiving the Services via telemedicine include:  °• Delay or interruption in medical evaluation due to technological equipment failure or disruption; °• Information transmitted may not be sufficient (e.g. poor resolution of images) to allow for appropriate medical decision making by the  Practitioner; and/or  °• In rare instances, security protocols could fail, causing a breach of personal health information. ° °Furthermore, I acknowledge that it is my responsibility to provide information about my medical history, conditions and care that is complete and accurate to the best of my ability. I acknowledge that Practitioner's advice, recommendations, and/or decision may be based on factors not within their control, such as incomplete or inaccurate data provided by me or distortions of diagnostic images or specimens that may result from electronic transmissions. I understand that the practice of medicine is not an exact science and that Practitioner makes no warranties or guarantees regarding treatment outcomes. I acknowledge that a copy of this consent can be made available to me via my patient portal ([facility] MyChart), or I can request a printed copy by calling the office of [facility].   ° °I understand that my insurance will be billed for this visit.  ° °I have read or had this consent read to me. °• I understand the contents of this consent, which adequately explains the benefits and risks of the Services being provided via telemedicine.  °• I have been provided ample opportunity to ask questions regarding this consent and the Services and have had my questions answered to my satisfaction. °• I give my informed consent for the services to be provided through the use of telemedicine in my medical care ° ° °

## 2020-03-08 NOTE — Progress Notes (Signed)
Virtual Visit via Telephone Note   This visit type was conducted due to national recommendations for restrictions regarding the COVID-19 Pandemic (e.g. social distancing) in an effort to limit this patient's exposure and mitigate transmission in our community.  Due to her co-morbid illnesses, this patient is at least at moderate risk for complications without adequate follow up.  This format is felt to be most appropriate for this patient at this time.  The patient did not have access to video technology/had technical difficulties with video requiring transitioning to audio format only (telephone).  All issues noted in this document were discussed and addressed.  No physical exam could be performed with this format.  Please refer to the patient's chart for her  consent to telehealth for [facility].    Date:  03/08/2020   ID:  Maria Robertson, DOB [id number], MRN [id number] The patient was identified using 2 identifiers.  Patient Location: Home Provider Location: Office/Clinic  PCP:  Karleen Hampshire, MD  Cardiologist:  Chrystie Nose, MD  Electrophysiologist:  None   Evaluation Performed:  Follow-Up Visit  Chief Complaint:  Follow up  History of Present Illness:    Maria Robertson is a 64 y.o. female with PMH of HTN, HLD, obesity and CAD.  She had a stent to the left circumflex in 2005.  Cardiac catheterization in 2011 revealed no critical lesion, patent left circumflex stent.  Patient was last seen by Dr. Rennis Golden in October 2020 at which time she was doing well.   Patient presents today for virtual visit.  For the past year, she has been doing well without any chest pain or shortness of breath.  Her most recent lab work was actually obtained by her PCP on 09/15/2019.  Renal function the potassium level was normal.  Liver function okay.  Hemoglobin A1c 5.7.  Total cholesterol 154, HDL 56, LDL 77, triglyceride 116.  When compared to the previous lab work in 2019, her LDL increased  slightly.  I recommended diet and exercise and repeat blood work during her next annual physical with her PCP.  Overall, she is doing well from the cardiac perspective and denies any lower extremity edema, orthopnea or PND.  The patient does not have symptoms concerning for COVID-19 infection (fever, chills, cough, or new shortness of breath).    Past Medical History:  Diagnosis Date  . Coronary artery disease    a. 2005 MI/PCI: LCX (2.5x8 Cypher DES);  b. 2011 USA/Cath: stable anatomy & patent stent; c. 05/2014 ETT: Ex time 7 mins.  Neg study.  . Dyslipidemia   . Essential hypertension   . GERD (gastroesophageal reflux disease)   . Myocardial infarction (HCC) 2005  . Obesity    Past Surgical History:  Procedure Laterality Date  . CARDIAC CATHETERIZATION  2005   showed 80% stenosis in left circumflex, 2.5x21mm Taxus drug-eluting stent   . FRACTURE SURGERY     right foot     Current Meds  Medication Sig  . aspirin 81 MG tablet Take 81 mg by mouth daily.  Marland Kitchen atorvastatin (LIPITOR) 20 MG tablet TAKE 1 TABLET BY MOUTH EVERYDAY AT BEDTIME  . clopidogrel (PLAVIX) 75 MG tablet Take 1 tablet (75 mg total) by mouth daily.  Marland Kitchen losartan (COZAAR) 25 MG tablet TAKE 1 TABLET (25 MG TOTAL) BY MOUTH DAILY. PLEASE CONTACT OUR OFFICE TO SCHEDULE OFFICE VISIT.  Marland Kitchen nebivolol (BYSTOLIC) 10 MG tablet Take 1 tablet (10 mg total) by mouth daily.  . nitroGLYCERIN (NITROSTAT)  0.4 MG SL tablet PLACE 1 TABLET (0.4 MG TOTAL) UNDER THE TONGUE EVERY 5 (FIVE) MINUTES AS NEEDED FOR CHEST PAIN.  Marland Kitchen pantoprazole (PROTONIX) 40 MG tablet Take 1 tablet (40 mg total) by mouth daily.     Allergies:   Patient has no known allergies.   Social History   Tobacco Use  . Smoking status: Former Smoker    Years: 20.00    Types: Cigarettes    Quit date: 01/16/2010    Years since quitting: 10.1  . Smokeless tobacco: Never Used  Substance Use Topics  . Alcohol use: No  . Drug use: No     Family Hx: The patient's family  history includes Breast cancer in her maternal great-grandmother; Diabetes in her mother; Heart disease in her father; Hypertension in her mother.  ROS:   Please see the history of present illness.     All other systems reviewed and are negative.   Prior CV studies:   The following studies were reviewed today:  ETT 05/31/2014 Comments:  <50mm upsloping ST depression inferiorly  Fair exercise tolerance  Normal BP response  No chest pain, but significant dyspnea   Labs/Other Tests and Data Reviewed:    EKG:  An ECG dated 03/02/2019 was personally reviewed today and demonstrated:  Normal sinus rhythm, T wave inversion in V1 and V2  Recent Labs: No results found for requested labs within last 8760 hours.   Recent Lipid Panel Lab Results  Component Value Date/Time   CHOL 138 12/13/2017 02:38 PM   CHOL 142 03/30/2013 11:51 AM   TRIG 83 12/13/2017 02:38 PM   TRIG 82 03/30/2013 11:51 AM   HDL 55 12/13/2017 02:38 PM   HDL 66 03/30/2013 11:51 AM   CHOLHDL 2.5 12/13/2017 02:38 PM   CHOLHDL 2.1 09/16/2016 11:13 AM   LDLCALC 66 12/13/2017 02:38 PM   LDLCALC 60 03/30/2013 11:51 AM    Wt Readings from Last 3 Encounters:  03/08/20 190 lb (86.2 kg)  03/02/19 182 lb 9.6 oz (82.8 kg)  12/13/17 194 lb (88 kg)     Risk Assessment/Calculations:      Objective:    Vital Signs:  BP 126/86   Ht 4\' 11"  (1.499 m)   Wt 190 lb (86.2 kg)   BMI 38.38 kg/m    VITAL SIGNS:  reviewed  ASSESSMENT & PLAN:    1. CAD: Denies any recent chest discomfort.  Continue aspirin, nebivolol, Plavix and Lipitor  2. Hypertension: blood pressure well controlled  3. Hyperlipidemia: On Lipitor   COVID-19 Education: The signs and symptoms of COVID-19 were discussed with the patient and how to seek care for testing (follow up with PCP or arrange E-visit).  The importance of social distancing was discussed today.  Time:   Today, I have spent 6 minutes with the patient with telehealth technology  discussing the above problems.     Medication Adjustments/Labs and Tests Ordered: Current medicines are reviewed at length with the patient today.  Concerns regarding medicines are outlined above.   Tests Ordered: No orders of the defined types were placed in this encounter.   Medication Changes: No orders of the defined types were placed in this encounter.   Follow Up:  In Person in 1 year(s)  Signed, , Azalee Course  03/08/2020 9:12 AM    [facility]

## 2020-03-08 NOTE — Telephone Encounter (Signed)
°  Patient Consent for Virtual Visit         [name] has provided verbal consent on 03/08/2020 for a virtual visit (video or telephone).   CONSENT FOR VIRTUAL VISIT FOR:  Maria Robertson  By participating in this virtual visit I agree to the following:  I hereby voluntarily request, consent and authorize [facility] and its employed or contracted physicians, physician assistants, nurse practitioners or other licensed health care professionals (the Practitioner), to provide me with telemedicine health care services (the Services") as deemed necessary by the treating Practitioner. I acknowledge and consent to receive the Services by the Practitioner via telemedicine. I understand that the telemedicine visit will involve communicating with the Practitioner through live audiovisual communication technology and the disclosure of certain medical information by electronic transmission. I acknowledge that I have been given the opportunity to request an in-person assessment or other available alternative prior to the telemedicine visit and am voluntarily participating in the telemedicine visit.  I understand that I have the right to withhold or withdraw my consent to the use of telemedicine in the course of my care at any time, without affecting my right to future care or treatment, and that the Practitioner or I may terminate the telemedicine visit at any time. I understand that I have the right to inspect all information obtained and/or recorded in the course of the telemedicine visit and may receive copies of available information for a reasonable fee.  I understand that some of the potential risks of receiving the Services via telemedicine include:   Delay or interruption in medical evaluation due to technological equipment failure or disruption;  Information transmitted may not be sufficient (e.g. poor resolution of images) to allow for appropriate medical decision making by the  Practitioner; and/or   In rare instances, security protocols could fail, causing a breach of personal health information.  Furthermore, I acknowledge that it is my responsibility to provide information about my medical history, conditions and care that is complete and accurate to the best of my ability. I acknowledge that Practitioner's advice, recommendations, and/or decision may be based on factors not within their control, such as incomplete or inaccurate data provided by me or distortions of diagnostic images or specimens that may result from electronic transmissions. I understand that the practice of medicine is not an exact science and that Practitioner makes no warranties or guarantees regarding treatment outcomes. I acknowledge that a copy of this consent can be made available to me via my patient portal [facility] MyChart), or I can request a printed copy by calling the office of [facility].    I understand that my insurance will be billed for this visit.   I have read or had this consent read to me.  I understand the contents of this consent, which adequately explains the benefits and risks of the Services being provided via telemedicine.   I have been provided ample opportunity to ask questions regarding this consent and the Services and have had my questions answered to my satisfaction.  I give my informed consent for the services to be provided through the use of telemedicine in my medical care

## 2020-03-08 NOTE — Patient Instructions (Signed)
Medication Instructions:  No Medication Changes  *If you need a refill on your cardiac medications before your next appointment, please call your pharmacy*   Lab Work: **No Labs If you have labs (blood work) drawn today and your tests are completely normal, you will receive your results only by: Marland Kitchen MyChart Message (if you have MyChart) OR . A paper copy in the mail If you have any lab test that is abnormal or we need to change your treatment, we will call you to review the results.   Testing/Procedures: No Testing   Follow-Up: At [facility], you and your health needs are our priority.  As part of our continuing mission to provide you with exceptional heart care, we have created designated Provider Care Teams.  These Care Teams include your primary Cardiologist (physician) and Advanced Practice Providers (APPs -  Physician Assistants and Nurse Practitioners) who all work together to provide you with the care you need, when you need it.   Your next appointment:   1 year(s)  The format for your next appointment:   In Person  Provider:   K. Italy Hilty, MD

## 2020-04-30 ENCOUNTER — Other Ambulatory Visit: Payer: Self-pay | Admitting: Internal Medicine

## 2021-01-21 ENCOUNTER — Telehealth: Payer: Self-pay | Admitting: Internal Medicine

## 2021-01-21 NOTE — Telephone Encounter (Signed)
   Pt c/o medication issue:  1. Name of Medication: Molnupiravir   2. How are you currently taking this medication (dosage and times per day)?   3. Are you having a reaction (difficulty breathing--STAT)?   4. What is your medication issue? Pt would like to ask if Dr. Rennis Golden can prescribed this meds for her, she said she tested covid + and her pcp cant do telehealth until tomorrow and she needs this right away

## 2021-01-21 NOTE — Telephone Encounter (Signed)
Informed pt she would have to contact pcp or urgent care to prescribe medication. Pt verbalized understanding.

## 2021-01-27 ENCOUNTER — Encounter (INDEPENDENT_AMBULATORY_CARE_PROVIDER_SITE_OTHER): Payer: Self-pay

## 2021-01-28 ENCOUNTER — Ambulatory Visit (INDEPENDENT_AMBULATORY_CARE_PROVIDER_SITE_OTHER): Payer: Commercial Managed Care - PPO | Admitting: Ophthalmology

## 2021-01-28 ENCOUNTER — Encounter (INDEPENDENT_AMBULATORY_CARE_PROVIDER_SITE_OTHER): Payer: Self-pay | Admitting: Ophthalmology

## 2021-01-28 ENCOUNTER — Other Ambulatory Visit: Payer: Self-pay

## 2021-01-28 DIAGNOSIS — H2513 Age-related nuclear cataract, bilateral: Secondary | ICD-10-CM | POA: Diagnosis not present

## 2021-01-28 DIAGNOSIS — H34831 Tributary (branch) retinal vein occlusion, right eye, with macular edema: Secondary | ICD-10-CM

## 2021-01-28 MED ORDER — BEVACIZUMAB 2.5 MG/0.1ML IZ SOSY
2.5000 mg | PREFILLED_SYRINGE | INTRAVITREAL | Status: AC | PRN
  Administered 2021-01-28: 2.5 mg via INTRAVITREAL

## 2021-01-28 NOTE — Progress Notes (Signed)
01/28/2021     CHIEF COMPLAINT Patient presents for  Chief Complaint  Patient presents with   Retina Evaluation      HISTORY OF PRESENT ILLNESS: Maria Robertson is a 65 y.o. female who presents to the clinic today for:   HPI     Retina Evaluation   In right eye.        Comments   NP- BRVO OD, referred by Dr. Otis Brace at [facility]. Patient states vision is stable, patient states she has floaters in her right eye, noticed them within the last couple months. "I thought it was mascara in my eye. I don't see them all the time though."        Last edited by Nelva Nay on 01/28/2021  9:47 AM.      Referring physician: Smitty Cords, OD [address] [place],  [place] [address]  HISTORICAL INFORMATION:   Selected notes from the MEDICAL RECORD NUMBER       CURRENT MEDICATIONS: No current outpatient medications on file. (Ophthalmic Drugs)   No current facility-administered medications for this visit. (Ophthalmic Drugs)   Current Outpatient Medications (Other)  Medication Sig   aspirin 81 MG tablet Take 81 mg by mouth daily.   atorvastatin (LIPITOR) 20 MG tablet Take 1 tablet (20 mg total) by mouth daily.   BYSTOLIC 10 MG tablet TAKE 1 TABLET BY MOUTH EVERY DAY   clopidogrel (PLAVIX) 75 MG tablet Take 1 tablet (75 mg total) by mouth daily.   losartan (COZAAR) 25 MG tablet Take 1 tablet (25 mg total) by mouth daily.   nitroGLYCERIN (NITROSTAT) 0.4 MG SL tablet Place 1 tablet (0.4 mg total) under the tongue every 5 (five) minutes as needed for chest pain.   pantoprazole (PROTONIX) 40 MG tablet Take 1 tablet (40 mg total) by mouth daily.   Current Facility-Administered Medications (Other)  Medication Route   nebivolol (BYSTOLIC) tablet 10 mg Oral      REVIEW OF SYSTEMS:    ALLERGIES No Known Allergies  PAST MEDICAL HISTORY Past Medical History:  Diagnosis Date   Coronary artery disease    a. 2005 MI/PCI: LCX (2.5x8 Cypher DES);  b. 2011 USA/Cath:  stable anatomy & patent stent; c. 05/2014 ETT: Ex time 7 mins.  Neg study.   Dyslipidemia    Essential hypertension    GERD (gastroesophageal reflux disease)    Myocardial infarction (HCC) 2005   Obesity    Past Surgical History:  Procedure Laterality Date   CARDIAC CATHETERIZATION  2005   showed 80% stenosis in left circumflex, 2.5x11mm Taxus drug-eluting stent    FRACTURE SURGERY     right foot    FAMILY HISTORY Family History  Problem Relation Age of Onset   Heart disease Father    Diabetes Mother    Hypertension Mother    Breast cancer Maternal Great-grandmother     SOCIAL HISTORY Social History   Tobacco Use   Smoking status: Former    Years: 20.00    Types: Cigarettes    Quit date: 01/16/2010    Years since quitting: 11.0   Smokeless tobacco: Never  Substance Use Topics   Alcohol use: No   Drug use: No         OPHTHALMIC EXAM:  Base Eye Exam     Visual Acuity (ETDRS)       Right Left   Dist cc 20/20 -2 20/25 +2    Correction: Glasses  Tonometry (Tonopen, 9:49 AM)       Right Left   Pressure 23 21         Pupils       Pupils Dark Light APD   Right PERRL 4 3 None   Left PERRL 4 3 None         Visual Fields (Counting fingers)       Left Right    Full Full         Extraocular Movement       Right Left    Full Full         Neuro/Psych     Oriented x3: Yes   Mood/Affect: Normal         Dilation     Both eyes: 1.0% Mydriacyl, 2.5% Phenylephrine @ 9:55 AM           Slit Lamp and Fundus Exam     External Exam       Right Left   External Normal Normal         Slit Lamp Exam       Right Left   Lids/Lashes Normal Normal   Conjunctiva/Sclera White and quiet White and quiet   Cornea Clear Clear   Anterior Chamber Deep and quiet Deep and quiet   Iris Round and reactive Round and reactive   Lens Clear Clear   Anterior Vitreous Normal Normal         Fundus Exam       Right Left   Posterior  Vitreous Normal Normal   Disc Normal Normal   C/D Ratio 0.05 0.15   Macula Normal Normal   Vessels Normal Normal   Periphery Normal Normal            IMAGING AND PROCEDURES  Imaging and Procedures for 01/28/21  OCT, Retina - OU - Both Eyes       Right Eye Quality was good. Scan locations included subfoveal. Central Foveal Thickness: 292. Progression has no prior data. Findings include abnormal foveal contour.   Left Eye Quality was good. Scan locations included subfoveal. Central Foveal Thickness: 268. Findings include normal foveal contour.   Notes Minor macular thickening superior and region of BRVO not in the center     Color Fundus Photography Optos - OU - Both Eyes       Right Eye Progression has no prior data. Disc findings include normal observations. Macula : hemorrhage. Vessels : normal observations. Periphery : normal observations.   Left Eye Progression has no prior data. Disc findings include normal observations. Macula : normal observations. Vessels : normal observations. Periphery : normal observations.   Notes Superior branch retinal vein occlusion with retinal hemorrhage encroaching into the superior portion of the macula not center involved.  We will commence with antivegF therapy today and follow-up again in 6 to 7 weeks     Intravitreal Injection, Pharmacologic Agent - OD - Right Eye       Time Out 01/28/2021. 10:31 AM. Confirmed correct patient, procedure, site, and patient consented.   Anesthesia Topical anesthesia was used. Anesthetic medications included Akten 3.5%.   Procedure Preparation included Tobramycin 0.3%, Ofloxacin , 10% betadine to eyelids, 5% betadine to ocular surface. A 30 gauge needle was used.   Injection: 2.5 mg bevacizumab 2.5 MG/0.1ML   Route: Intravitreal, Site: Right Eye   NDC: [phone]   Post-op Post injection exam found visual acuity of at least counting fingers. The patient tolerated the procedure well.  There  were no complications. The patient received written and verbal post procedure care education. Post injection medications included ocuflox.              ASSESSMENT/PLAN:  Cataract, nuclear sclerotic, both eyes The nature of cataract was discussed with the patient as well as the elective nature of surgery. The patient was reassured that surgery at a later date does not put the patient at risk for a worse outcome. It was emphasized that the need for surgery is dictated by the patient's quality of life as influenced by the cataract. Patient was instructed to maintain close follow up with their general eye care doctor.  OU follow-up with Dr. Smitty Cords as scheduled, consider cataract extraction so as to maximize visual potential in the future.  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  The patient was given access to printed information.  The treatment options including continued observation looking for spontaneous resolution versus grid laser versus intravitreal Kenalog injection were discussed.  PRIMARY THERAPY CONSISTS of Anti-VEGF Therapies, AVASTIN, LUCENTIS AND EYLEA.  Their usage was discussed to assist in halting the progression of Macular Edema, in order to preserve, protect or improve acuity.  Additionally, at times, limited focal laser therapy is used in the management.  The risks and benefits of all these options were discussed with the patient.  The patient's questions were answered.  Superior branch retinal vein occlusion with macular involvement but not center involved.  We will commence with therapy today to control and resolve this condition.  Patient will continue to monitor at home her blood pressure as ongoing untreated or poorly treated blood pressure can make this  condition worse.     ICD-10-CM   1. 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 Cataract, nuclear sclerotic, both eyes  H25.13       1.  OD, retinal vein occlusion encroaching upon the macula with minor thickening.  We will commence with therapy today antivegF.  I explained to the patient that getting this condition at this time.  Without impact on acuity at this time, very likely we will not need to treat for long-term unless blood pressure stays out of control.  2.  Follow-up in 6 to 7 weeks.  Patient prefers 7 weeks as patient has a month-long trip to [place] scheduled  3.  Dilate OD next likely injection antivegF  4.  I did explain the patient that procession to cataract surgery in either eye can proceed at any time of her convenience and under the direction Dr. Smitty Cords  Ophthalmic Meds Ordered this visit:  Meds ordered this encounter  Medications   bevacizumab (AVASTIN) SOSY 2.5 mg       Return in about 7 weeks (around 03/18/2021) for dilate, OD, AVASTIN OCT.  There are no Patient Instructions on file for this visit.   Explained the diagnoses, plan, and follow up with the patient and they expressed understanding.  Patient expressed understanding of the importance of proper follow up care.   Jillyn Hidden  Sabino Snipes M.D. Diseases & Surgery of the Retina and Vitreous [facility] 01/28/21     Abbreviations: M myopia (nearsighted); A astigmatism; H hyperopia (farsighted); P presbyopia; Mrx spectacle prescription;  CTL contact lenses; OD right eye; OS left eye; OU both eyes  XT exotropia; ET esotropia; PEK punctate epithelial keratitis; PEE punctate epithelial erosions; DES dry eye syndrome; MGD meibomian gland dysfunction; ATs artificial tears; PFAT's  preservative free artificial tears; NSC nuclear sclerotic cataract; PSC posterior subcapsular cataract; ERM epi-retinal membrane; PVD posterior vitreous detachment; RD retinal detachment; DM diabetes mellitus; DR diabetic retinopathy; NPDR non-proliferative diabetic retinopathy; PDR proliferative diabetic retinopathy; CSME clinically significant macular edema; DME diabetic macular edema; dbh dot blot hemorrhages; CWS cotton wool spot; POAG primary open angle glaucoma; C/D cup-to-disc ratio; HVF humphrey visual field; GVF goldmann visual field; OCT optical coherence tomography; IOP intraocular pressure; BRVO Branch retinal vein occlusion; CRVO central retinal vein occlusion; CRAO central retinal artery occlusion; BRAO branch retinal artery occlusion; RT retinal tear; SB scleral buckle; PPV pars plana vitrectomy; VH Vitreous hemorrhage; PRP panretinal laser photocoagulation; IVK intravitreal kenalog; VMT vitreomacular traction; MH Macular hole;  NVD neovascularization of the disc; NVE neovascularization elsewhere; AREDS age related eye disease study; ARMD age related macular degeneration; POAG primary open angle glaucoma; EBMD epithelial/anterior basement membrane dystrophy; ACIOL anterior chamber intraocular lens; IOL intraocular lens; PCIOL posterior chamber intraocular lens; Phaco/IOL phacoemulsification with intraocular lens placement; PRK photorefractive keratectomy; LASIK laser assisted in situ keratomileusis; HTN hypertension; DM diabetes mellitus; COPD chronic obstructive pulmonary disease

## 2021-01-28 NOTE — Assessment & Plan Note (Signed)
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  The patient was given access to printed information.  The treatment options including continued observation looking for spontaneous resolution versus grid laser versus intravitreal Kenalog injection were discussed.  PRIMARY THERAPY CONSISTS of Anti-VEGF Therapies, AVASTIN, LUCENTIS AND EYLEA.  Their usage was discussed to assist in halting the progression of Macular Edema, in order to preserve, protect or improve acuity.  Additionally, at times, limited focal laser therapy is used in the management.  The risks and benefits of all these options were discussed with the patient.  The patient's questions were answered.  Superior branch retinal vein occlusion with macular involvement but not center involved.  We will commence with therapy today to control and resolve this condition.  Patient will continue to monitor at home her blood pressure as ongoing untreated or poorly treated blood pressure can make this condition worse.

## 2021-01-28 NOTE — Assessment & Plan Note (Addendum)
The nature of cataract was discussed with the patient as well as the elective nature of surgery. The patient was reassured that surgery at a later date does not put the patient at risk for a worse outcome. It was emphasized that the need for surgery is dictated by the patient's quality of life as influenced by the cataract. Patient was instructed to maintain close follow up with their general eye care doctor.  OU follow-up with Dr. Smitty Cords as scheduled, consider cataract extraction so as to maximize visual potential in the future.  There is no need to concern with the ongoing therapy of the branch retinal vein occlusion in the right eye

## 2021-03-18 ENCOUNTER — Encounter (INDEPENDENT_AMBULATORY_CARE_PROVIDER_SITE_OTHER): Payer: Commercial Managed Care - PPO | Admitting: Ophthalmology

## 2021-03-24 ENCOUNTER — Encounter (INDEPENDENT_AMBULATORY_CARE_PROVIDER_SITE_OTHER): Payer: Self-pay | Admitting: Ophthalmology

## 2021-03-24 ENCOUNTER — Ambulatory Visit (INDEPENDENT_AMBULATORY_CARE_PROVIDER_SITE_OTHER): Payer: Medicare Other | Admitting: Ophthalmology

## 2021-03-24 ENCOUNTER — Other Ambulatory Visit: Payer: Self-pay

## 2021-03-24 DIAGNOSIS — H34831 Tributary (branch) retinal vein occlusion, right eye, with macular edema: Secondary | ICD-10-CM | POA: Diagnosis not present

## 2021-03-24 DIAGNOSIS — R0683 Snoring: Secondary | ICD-10-CM

## 2021-03-24 MED ORDER — BEVACIZUMAB 2.5 MG/0.1ML IZ SOSY
2.5000 mg | PREFILLED_SYRINGE | INTRAVITREAL | Status: AC | PRN
  Administered 2021-03-24: 2.5 mg via INTRAVITREAL

## 2021-03-24 NOTE — Assessment & Plan Note (Signed)
the practice of Dr. Luciana Axe has discovered an association with sleep apnea with its nightly periods of low oxygen in the blood stream (hypoxia), retained carbon dioxide (hypercapnia), associated with transient nocturnal hypertensive episodes.   More recently, some patients also been found to have advanced lung disease, whether asthma or COPD, with similar findings.  Dr. Luciana Axe has been evaluating the association of sleep apnea, nightly hypoxia, and worsening of eye conditions  for over 20  years.  Most patients are found to be noncompliant with sleep apnea therapy or testing in the past.  Resumption of CPAP or similar therapy is strongly recommended if ordered in the past.  Upon review of risk factors or findings positive for sleep apnea, more formal, extensive sleep laboratory or home testing, may be recommended.    If the condition is found to be present, treatment has improved or resolved their eye condition promptly, sometimes within weeks, of the use of nighttime oxygen supplementation or continuous positive airway pressure (CPAP).

## 2021-03-24 NOTE — Progress Notes (Signed)
03/24/2021     CHIEF COMPLAINT Patient presents for  Chief Complaint  Patient presents with   Retina Follow Up      HISTORY OF PRESENT ILLNESS: Maria Robertson is a 65 y.o. female who presents to the clinic today for:   HPI     Retina Follow Up   Patient presents with  CRVO/BRVO.  In right eye.  This started 7 weeks ago.  Duration of 7 weeks.  Since onset it is stable.      Last edited by Frederik Pear, COA on 03/24/2021  9:06 AM.      Referring physician: No referring provider defined for this encounter.  HISTORICAL INFORMATION:   Selected notes from the MEDICAL RECORD NUMBER       CURRENT MEDICATIONS: No current outpatient medications on file. (Ophthalmic Drugs)   No current facility-administered medications for this visit. (Ophthalmic Drugs)   Current Outpatient Medications (Other)  Medication Sig   aspirin 81 MG tablet Take 81 mg by mouth daily.   atorvastatin (LIPITOR) 20 MG tablet Take 1 tablet (20 mg total) by mouth daily.   BYSTOLIC 10 MG tablet TAKE 1 TABLET BY MOUTH EVERY DAY   clopidogrel (PLAVIX) 75 MG tablet Take 1 tablet (75 mg total) by mouth daily.   losartan (COZAAR) 25 MG tablet Take 1 tablet (25 mg total) by mouth daily.   nitroGLYCERIN (NITROSTAT) 0.4 MG SL tablet Place 1 tablet (0.4 mg total) under the tongue every 5 (five) minutes as needed for chest pain.   pantoprazole (PROTONIX) 40 MG tablet Take 1 tablet (40 mg total) by mouth daily.   Current Facility-Administered Medications (Other)  Medication Route   nebivolol (BYSTOLIC) tablet 10 mg Oral      REVIEW OF SYSTEMS:    ALLERGIES No Known Allergies  PAST MEDICAL HISTORY Past Medical History:  Diagnosis Date   Coronary artery disease    a. 2005 MI/PCI: LCX (2.5x8 Cypher DES);  b. 2011 USA/Cath: stable anatomy & patent stent; c. 05/2014 ETT: Ex time 7 mins.  Neg study.   Dyslipidemia    Essential hypertension    GERD (gastroesophageal reflux disease)    Myocardial  infarction (HCC) 2005   Obesity    Past Surgical History:  Procedure Laterality Date   CARDIAC CATHETERIZATION  2005   showed 80% stenosis in left circumflex, 2.5x49mm Taxus drug-eluting stent    FRACTURE SURGERY     right foot    FAMILY HISTORY Family History  Problem Relation Age of Onset   Heart disease Father    Diabetes Mother    Hypertension Mother    Breast cancer Maternal Great-grandmother     SOCIAL HISTORY Social History   Tobacco Use   Smoking status: Former    Years: 20.00    Types: Cigarettes    Quit date: 01/16/2010    Years since quitting: 11.1   Smokeless tobacco: Never  Substance Use Topics   Alcohol use: No   Drug use: No         OPHTHALMIC EXAM:  Base Eye Exam     Visual Acuity (ETDRS)       Right Left   Dist cc 20/20 -1 20/20    Correction: Glasses         Tonometry (Tonopen, 9:12 AM)       Right Left   Pressure 19 17         Pupils       Pupils Dark Light Shape  React APD   Right PERRL 4 2 Round Brisk None   Left PERRL 4 2 Round Brisk None         Visual Fields (Counting fingers)       Left Right    Full Full         Extraocular Movement       Right Left    Full, Ortho Full, Ortho         Neuro/Psych     Oriented x3: Yes   Mood/Affect: Normal         Dilation     Right eye: 1.0% Mydriacyl, 2.5% Phenylephrine @ 9:12 AM           Slit Lamp and Fundus Exam     External Exam       Right Left   External Normal Normal         Slit Lamp Exam       Right Left   Lids/Lashes Normal Normal   Conjunctiva/Sclera White and quiet White and quiet   Cornea Clear Clear   Anterior Chamber Deep and quiet Deep and quiet   Iris Round and reactive Round and reactive   Lens Clear Clear   Anterior Vitreous Normal Normal         Fundus Exam       Right Left   Posterior Vitreous Normal    Disc Normal    C/D Ratio 0.05 0.15   Macula No thickening, scattered microaneurysms    Vessels Superior  partial BRVO, along the superotemporal arcade with intraretinal hemorrhage secondary CME, overall less intraretinal hemorrhage post Avastin    Periphery Normal             IMAGING AND PROCEDURES  Imaging and Procedures for 03/24/21  OCT, Retina - OU - Both Eyes       Right Eye Quality was good. Scan locations included subfoveal. Central Foveal Thickness: 280. Progression has improved. Findings include abnormal foveal contour.   Left Eye Quality was good. Scan locations included subfoveal. Central Foveal Thickness: 264. Findings include normal foveal contour.   Notes Minor macular thickening superior and region of BRVO not in the center, now vastly improved post Avastin No. 1 at currently nearly 8-week interval.  Repeat injection today and maintain 8-week follow-up     Intravitreal Injection, Pharmacologic Agent - OD - Right Eye       Time Out 03/24/2021. 9:43 AM. Confirmed correct patient, procedure, site, and patient consented.   Anesthesia Topical anesthesia was used. Anesthetic medications included Lidocaine 4%.   Procedure Preparation included Tobramycin 0.3%, Ofloxacin , 10% betadine to eyelids, 5% betadine to ocular surface. A 30 gauge needle was used.   Injection: 2.5 mg bevacizumab 2.5 MG/0.1ML   Route: Intravitreal, Site: Right Eye   NDC: [phone], Lot: [id number]   Post-op Post injection exam found visual acuity of at least counting fingers. The patient tolerated the procedure well. There were no complications. The patient received written and verbal post procedure care education. Post injection medications included ocuflox.              ASSESSMENT/PLAN:  Snores  the practice of Dr. Luciana Axe has discovered an association with sleep apnea with its nightly periods of low oxygen in the blood stream (hypoxia), retained carbon dioxide (hypercapnia), associated with transient nocturnal hypertensive episodes.   More recently, some patients also been found to  have advanced lung disease, whether asthma or COPD, with similar findings.  Dr. Luciana Axe has  been evaluating the association of sleep apnea, nightly hypoxia, and worsening of eye conditions  for over 20  years.  Most patients are found to be noncompliant with sleep apnea therapy or testing in the past.  Resumption of CPAP or similar therapy is strongly recommended if ordered in the past.  Upon review of risk factors or findings positive for sleep apnea, more formal, extensive sleep laboratory or home testing, may be recommended.    If the condition is found to be present, treatment has improved or resolved their eye condition promptly, sometimes within weeks, of the use of nighttime oxygen supplementation or continuous positive airway pressure (CPAP).     ICD-10-CM   1. 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 Snores  R06.83       1.  OD, improved CME post Avastin No. 1 for branch retinal vein occlusion.  I discussed with the patient the critical importance of continued therapy to prevent progression of this condition I explained the for the issue of continued blood pressure control and monitoring  2.  Moreover I brought up the notion of if she has potential for sleep apnea to consider testing, and she of promptly chuckled saying that she was in fact recently informed by his sister that she does snore and hold her breath at night.  3.  I urged the patient and discussed the critical importance of nightly complete oxygenation of the body and brain and the eyes by treatment of sleep apnea if it is found to be present.  I have encouraged her to seek evaluation through a complete registering her complaints and and symptoms to her cardiologist who might be able to assist her in scheduling a sleep study  Ophthalmic Meds Ordered this visit:   Meds ordered this encounter  Medications   bevacizumab (AVASTIN) SOSY 2.5 mg       Return in about 8 weeks (around 05/19/2021) for DILATE OU, AVASTIN OCT, OD.  There are no Patient Instructions on file for this visit.   Explained the diagnoses, plan, and follow up with the patient and they expressed understanding.  Patient expressed understanding of the importance of proper follow up care.   Alford Highland Sheilla Maris M.D. Diseases & Surgery of the Retina and Vitreous [facility] 03/24/21     Abbreviations: M myopia (nearsighted); A astigmatism; H hyperopia (farsighted); P presbyopia; Mrx spectacle prescription;  CTL contact lenses; OD right eye; OS left eye; OU both eyes  XT exotropia; ET esotropia; PEK punctate epithelial keratitis; PEE punctate epithelial erosions; DES dry eye syndrome; MGD meibomian gland dysfunction; ATs artificial tears; PFAT's preservative free artificial tears; NSC nuclear sclerotic cataract; PSC posterior subcapsular cataract; ERM epi-retinal membrane; PVD posterior vitreous detachment; RD retinal detachment; DM diabetes mellitus; DR diabetic retinopathy; NPDR non-proliferative diabetic retinopathy; PDR proliferative diabetic retinopathy; CSME clinically significant macular edema; DME diabetic macular edema; dbh dot blot hemorrhages; CWS cotton wool spot; POAG primary open angle glaucoma; C/D cup-to-disc ratio; HVF humphrey visual field; GVF goldmann visual field; OCT optical coherence tomography; IOP intraocular pressure; BRVO Branch retinal vein occlusion; CRVO central retinal vein occlusion; CRAO central retinal artery occlusion; BRAO branch retinal artery occlusion; RT retinal tear; SB scleral buckle; PPV pars plana vitrectomy; VH Vitreous hemorrhage; PRP panretinal laser photocoagulation; IVK intravitreal  kenalog; VMT vitreomacular traction; MH Macular hole;  NVD neovascularization of the disc; NVE neovascularization elsewhere; AREDS age related eye disease  study; ARMD age related macular degeneration; POAG primary open angle glaucoma; EBMD epithelial/anterior basement membrane dystrophy; ACIOL anterior chamber intraocular lens; IOL intraocular lens; PCIOL posterior chamber intraocular lens; Phaco/IOL phacoemulsification with intraocular lens placement; PRK photorefractive keratectomy; LASIK laser assisted in situ keratomileusis; HTN hypertension; DM diabetes mellitus; COPD chronic obstructive pulmonary disease

## 2021-04-24 ENCOUNTER — Other Ambulatory Visit: Payer: Self-pay | Admitting: Internal Medicine

## 2021-04-24 ENCOUNTER — Other Ambulatory Visit: Payer: Self-pay | Admitting: Physician Assistant

## 2021-05-13 ENCOUNTER — Ambulatory Visit: Payer: Medicare Other | Admitting: Internal Medicine

## 2021-05-20 ENCOUNTER — Encounter (INDEPENDENT_AMBULATORY_CARE_PROVIDER_SITE_OTHER): Payer: Medicare Other | Admitting: Ophthalmology

## 2021-06-09 ENCOUNTER — Encounter (INDEPENDENT_AMBULATORY_CARE_PROVIDER_SITE_OTHER): Payer: Medicare Other | Admitting: Ophthalmology

## 2021-06-19 ENCOUNTER — Ambulatory Visit (INDEPENDENT_AMBULATORY_CARE_PROVIDER_SITE_OTHER): Payer: Medicare Other | Admitting: Ophthalmology

## 2021-06-19 ENCOUNTER — Other Ambulatory Visit: Payer: Self-pay

## 2021-06-19 ENCOUNTER — Encounter (INDEPENDENT_AMBULATORY_CARE_PROVIDER_SITE_OTHER): Payer: Self-pay | Admitting: Ophthalmology

## 2021-06-19 DIAGNOSIS — H2513 Age-related nuclear cataract, bilateral: Secondary | ICD-10-CM

## 2021-06-19 DIAGNOSIS — H34831 Tributary (branch) retinal vein occlusion, right eye, with macular edema: Secondary | ICD-10-CM

## 2021-06-19 MED ORDER — BEVACIZUMAB 2.5 MG/0.1ML IZ SOSY
2.5000 mg | PREFILLED_SYRINGE | INTRAVITREAL | Status: AC | PRN
  Administered 2021-06-19: 2.5 mg via INTRAVITREAL

## 2021-06-19 NOTE — Assessment & Plan Note (Signed)
Today postinjection #2 Avastin, with less macular edema less engorged vein, 12-week interval  Repeat injection today to maintain gains occurred with injections and we will extend interval next examination to 14 weeks to observe

## 2021-06-19 NOTE — Progress Notes (Signed)
06/19/2021     CHIEF COMPLAINT Patient presents for  Chief Complaint  Patient presents with   Retina Follow Up      HISTORY OF PRESENT ILLNESS: Maria Robertson is a 66 y.o. female who presents to the clinic today for:   HPI     Retina Follow Up           Diagnosis: CRVO/BRVO   Laterality: right eye   Onset: 12   Severity: mild   Duration: 12 weeks   Course: stable         Comments   12 weeks fu dilate OU and OCT and Avastin OD- BRVO with macular edema OD  Pt states, "I can still tell that I my right eye has those floaters in there that kind of makes me think that there is still something going on with my eye. Other than that I think my vision is about the same."       Last edited by Demetrios Loll, COA on 06/19/2021  9:56 AM.      Referring physician: No referring provider defined for this encounter.  HISTORICAL INFORMATION:   Selected notes from the MEDICAL RECORD NUMBER       CURRENT MEDICATIONS: No current outpatient medications on file. (Ophthalmic Drugs)   No current facility-administered medications for this visit. (Ophthalmic Drugs)   Current Outpatient Medications (Other)  Medication Sig   aspirin 81 MG tablet Take 81 mg by mouth daily.   atorvastatin (LIPITOR) 20 MG tablet TAKE 1 TABLET BY MOUTH EVERY DAY   clopidogrel (PLAVIX) 75 MG tablet TAKE 1 TABLET BY MOUTH EVERY DAY   losartan (COZAAR) 25 MG tablet TAKE 1 TABLET BY MOUTH EVERY DAY   nebivolol (BYSTOLIC) 10 MG tablet TAKE 1 TABLET BY MOUTH EVERY DAY   nitroGLYCERIN (NITROSTAT) 0.4 MG SL tablet Place 1 tablet (0.4 mg total) under the tongue every 5 (five) minutes as needed for chest pain.   pantoprazole (PROTONIX) 40 MG tablet TAKE 1 TABLET BY MOUTH EVERY DAY   Current Facility-Administered Medications (Other)  Medication Route   nebivolol (BYSTOLIC) tablet 10 mg Oral      REVIEW OF SYSTEMS:    ALLERGIES No Known Allergies  PAST MEDICAL HISTORY Past Medical History:   Diagnosis Date   Coronary artery disease    a. 2005 MI/PCI: LCX (2.5x8 Cypher DES);  b. 2011 USA/Cath: stable anatomy & patent stent; c. 05/2014 ETT: Ex time 7 mins.  Neg study.   Dyslipidemia    Essential hypertension    GERD (gastroesophageal reflux disease)    Myocardial infarction (HCC) 2005   Obesity    Past Surgical History:  Procedure Laterality Date   CARDIAC CATHETERIZATION  2005   showed 80% stenosis in left circumflex, 2.5x24mm Taxus drug-eluting stent    FRACTURE SURGERY     right foot    FAMILY HISTORY Family History  Problem Relation Age of Onset   Heart disease Father    Diabetes Mother    Hypertension Mother    Breast cancer Maternal Great-grandmother     SOCIAL HISTORY Social History   Tobacco Use   Smoking status: Former    Years: 20.00    Types: Cigarettes    Quit date: 01/16/2010    Years since quitting: 11.4   Smokeless tobacco: Never  Substance Use Topics   Alcohol use: No   Drug use: No         OPHTHALMIC EXAM:  Base Eye Exam  Visual Acuity (Snellen - Linear)       Right Left   Dist cc 20/20 -1 20/20         Tonometry (Tonopen, 9:59 AM)       Right Left   Pressure 20 19         Pupils       Pupils Shape React APD   Right PERRL Round Brisk None   Left PERRL Round Brisk None         Visual Fields (Counting fingers)       Left Right    Full Full         Extraocular Movement       Right Left    Full, Ortho Full, Ortho         Neuro/Psych     Oriented x3: Yes   Mood/Affect: Normal         Dilation     Both eyes: 1.0% Mydriacyl, 2.5% Phenylephrine @ 9:59 AM           Slit Lamp and Fundus Exam     External Exam       Right Left   External Normal Normal         Slit Lamp Exam       Right Left   Lids/Lashes Normal Normal   Conjunctiva/Sclera White and quiet White and quiet   Cornea Clear Clear   Anterior Chamber Deep and quiet Deep and quiet   Iris Round and reactive Round and  reactive   Lens Clear Clear   Anterior Vitreous Normal Normal         Fundus Exam       Right Left   Posterior Vitreous Normal Normal   Disc Normal Normal   C/D Ratio 0.05 0.15   Macula No thickening, scattered microaneurysms Normal   Vessels Overall much less intraretinal hemorrhage peripherally, normal retinal vasculature calibers Normal   Periphery Normal Normal            IMAGING AND PROCEDURES  Imaging and Procedures for 06/19/21  OCT, Retina - OU - Both Eyes       Right Eye Quality was good. Scan locations included subfoveal. Central Foveal Thickness: 279. Progression has improved. Findings include abnormal foveal contour.   Left Eye Quality was good. Scan locations included subfoveal. Central Foveal Thickness: 269. Findings include normal foveal contour.   Notes Minor macular thickening superior and region of BRVO not in the center, now vastly improved post Avastin No. 2 at currently nearly 12-week interval.  Repeat injection today and maintain 14-week follow-up       Intravitreal Injection, Pharmacologic Agent - OD - Right Eye       Time Out 06/19/2021. 10:40 AM. Confirmed correct patient, procedure, site, and patient consented.   Anesthesia Topical anesthesia was used. Anesthetic medications included Lidocaine 4%.   Procedure Preparation included Tobramycin 0.3%, Ofloxacin , 10% betadine to eyelids, 5% betadine to ocular surface. A 30 gauge needle was used.   Injection: 2.5 mg bevacizumab 2.5 MG/0.1ML   Route: Intravitreal, Site: Right Eye   NDC: [phone]   Post-op Post injection exam found visual acuity of at least counting fingers. The patient tolerated the procedure well. There were no complications. The patient received written and verbal post procedure care education. Post injection medications included ocuflox.              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 Cataract, nuclear sclerotic, both eyes  H25.13       1.  OD vastly improved macular findings, and retinal vascular calibers are returning to normal with no thickening.  We will repeat injection today at 12 weeks and extend interval examination 14 weeks  2.  Will consider fluorescein angiography right left next so as to document the extent of retinal vascular ongoing disease of present  3.  Ophthalmic Meds Ordered this visit:  Meds ordered this encounter  Medications   bevacizumab (AVASTIN) SOSY 2.5 mg       Return in about 14 weeks (around 09/25/2021) for DILATE OU, OD, and only possible Avastin, OCT, COLOR FP, OPTOS FFA R/L.  There are no Patient Instructions on file for this visit.   Explained the diagnoses, plan, and follow up with the patient and they expressed understanding.  Patient expressed understanding of the importance of proper follow up care.   Alford HighlandGary A. Mckynlee Luse M.D. Diseases & Surgery of the Retina and Vitreous [facility] 06/19/21     Abbreviations: M myopia (nearsighted); A astigmatism; H hyperopia (farsighted); P presbyopia; Mrx spectacle prescription;  CTL contact lenses; OD right eye; OS left eye; OU both eyes  XT exotropia; ET esotropia; PEK punctate epithelial keratitis; PEE punctate epithelial erosions; DES dry eye syndrome; MGD meibomian gland dysfunction; ATs artificial tears; PFAT's preservative free artificial tears; NSC nuclear sclerotic cataract; PSC  posterior subcapsular cataract; ERM epi-retinal membrane; PVD posterior vitreous detachment; RD retinal detachment; DM diabetes mellitus; DR diabetic retinopathy; NPDR non-proliferative diabetic retinopathy; PDR proliferative diabetic retinopathy; CSME clinically significant macular edema; DME diabetic macular edema; dbh dot blot hemorrhages; CWS cotton wool spot; POAG primary open angle glaucoma; C/D cup-to-disc ratio; HVF humphrey visual field; GVF goldmann visual field; OCT optical coherence tomography; IOP intraocular pressure; BRVO Branch retinal vein occlusion; CRVO central retinal vein occlusion; CRAO central retinal artery occlusion; BRAO branch retinal artery occlusion; RT retinal tear; SB scleral buckle; PPV pars plana vitrectomy; VH Vitreous hemorrhage; PRP panretinal laser photocoagulation; IVK intravitreal kenalog; VMT vitreomacular traction; MH Macular hole;  NVD neovascularization of the disc; NVE neovascularization elsewhere; AREDS age related eye disease study; ARMD age related macular degeneration; POAG primary open angle glaucoma; EBMD epithelial/anterior basement membrane dystrophy; ACIOL anterior chamber intraocular lens; IOL intraocular lens; PCIOL posterior chamber intraocular lens; Phaco/IOL phacoemulsification with intraocular lens placement; PRK photorefractive keratectomy; LASIK laser assisted in situ keratomileusis; HTN hypertension; DM diabetes mellitus; COPD chronic obstructive pulmonary disease

## 2021-06-19 NOTE — Assessment & Plan Note (Signed)
Stable OU 

## 2021-07-08 ENCOUNTER — Ambulatory Visit: Payer: Medicare Other | Admitting: Internal Medicine

## 2021-07-23 ENCOUNTER — Other Ambulatory Visit: Payer: Self-pay | Admitting: Internal Medicine

## 2021-08-27 ENCOUNTER — Ambulatory Visit: Payer: Medicare Other | Admitting: Physician Assistant

## 2021-09-25 ENCOUNTER — Ambulatory Visit (INDEPENDENT_AMBULATORY_CARE_PROVIDER_SITE_OTHER): Payer: Medicare Other | Admitting: Ophthalmology

## 2021-09-25 DIAGNOSIS — H34831 Tributary (branch) retinal vein occlusion, right eye, with macular edema: Secondary | ICD-10-CM | POA: Diagnosis not present

## 2021-09-25 DIAGNOSIS — R0683 Snoring: Secondary | ICD-10-CM

## 2021-09-25 DIAGNOSIS — H43812 Vitreous degeneration, left eye: Secondary | ICD-10-CM | POA: Diagnosis not present

## 2021-09-25 MED ORDER — BEVACIZUMAB 2.5 MG/0.1ML IZ SOSY
2.5000 mg | PREFILLED_SYRINGE | INTRAVITREAL | Status: AC | PRN
  Administered 2021-09-25: 2.5 mg via INTRAVITREAL

## 2021-09-25 NOTE — Progress Notes (Signed)
? ? ?09/25/2021 ? ?  ? ?CHIEF COMPLAINT ?Patient presents for  ?Chief Complaint  ?Patient presents with  ? Branch Retinal Vein Occlusion  ? ? ? ? ?HISTORY OF PRESENT ILLNESS: ?Maria Robertson is a 66 y.o. female who presents to the clinic today for:  ? ?HPI   ?14 weeks for DILATE OU, OD and only possible AVASTIN, OCT, COLOR FP, OPTOS FFA R/L ?Pt denies FOL but new floaters in left eye. Pt described floaters as lines at the top. It started about 2 weeks ago. ? ?Pt stated vision has been stable. ? ?Last edited by Silvestre Moment on 09/25/2021 11:02 AM.  ?  ? ? ?Referring physician: ?No referring provider defined for this encounter. ? ?HISTORICAL INFORMATION:  ? ?Selected notes from the [facility] ?  ?   ? ?CURRENT MEDICATIONS: ?No current outpatient medications on file. (Ophthalmic Drugs)  ? ?No current facility-administered medications for this visit. (Ophthalmic Drugs)  ? ?Current Outpatient Medications (Other)  ?Medication Sig  ? aspirin 81 MG tablet Take 81 mg by mouth daily.  ? atorvastatin (LIPITOR) 20 MG tablet TAKE 1 TABLET BY MOUTH EVERY DAY  ? clopidogrel (PLAVIX) 75 MG tablet TAKE 1 TABLET BY MOUTH EVERY DAY  ? losartan (COZAAR) 25 MG tablet TAKE 1 TABLET BY MOUTH EVERY DAY  ? nebivolol (BYSTOLIC) 10 MG tablet TAKE 1 TABLET BY MOUTH EVERY DAY  ? nitroGLYCERIN (NITROSTAT) 0.4 MG SL tablet Place 1 tablet (0.4 mg total) under the tongue every 5 (five) minutes as needed for chest pain.  ? pantoprazole (PROTONIX) 40 MG tablet TAKE 1 TABLET BY MOUTH EVERY DAY  ? ?Current Facility-Administered Medications (Other)  ?Medication Route  ? nebivolol (BYSTOLIC) tablet 10 mg Oral  ? ? ? ? ?REVIEW OF SYSTEMS: ?ROS   ?Negative for: Constitutional, Gastrointestinal, Neurological, Skin, Genitourinary, Musculoskeletal, HENT, Endocrine, Cardiovascular, Eyes, Respiratory, Psychiatric, Allergic/Imm, Heme/Lymph ?Last edited by Silvestre Moment on 09/25/2021 11:02 AM.  ?  ? ? ? ?ALLERGIES ?No Known Allergies ? ?PAST MEDICAL HISTORY ?Past  Medical History:  ?Diagnosis Date  ? Coronary artery disease   ? a. 2005 MI/PCI: LCX (2.5x8 Cypher DES);  b. 2011 USA/Cath: stable anatomy & patent stent; c. 05/2014 ETT: Ex time 7 mins.  Neg study.  ? Dyslipidemia   ? Essential hypertension   ? GERD (gastroesophageal reflux disease)   ? Myocardial infarction [facility]) 2005  ? Obesity   ? ?Past Surgical History:  ?Procedure Laterality Date  ? CARDIAC CATHETERIZATION  2005  ? showed 80% stenosis in left circumflex, 2.5x53mm Taxus drug-eluting stent   ? FRACTURE SURGERY    ? right foot  ? ? ?FAMILY HISTORY ?Family History  ?Problem Relation Age of Onset  ? Heart disease Father   ? Diabetes Mother   ? Hypertension Mother   ? Breast cancer Maternal Great-grandmother   ? ? ?SOCIAL HISTORY ?Social History  ? ?Tobacco Use  ? Smoking status: Former  ?  Years: 20.00  ?  Types: Cigarettes  ?  Quit date: 01/16/2010  ?  Years since quitting: 11.6  ? Smokeless tobacco: Never  ?Substance Use Topics  ? Alcohol use: No  ? Drug use: No  ? ?  ? ?  ? ?OPHTHALMIC EXAM: ? ?Base Eye Exam   ? ? Visual Acuity (ETDRS)   ? ?   Right Left  ? Dist cc 20/20 20/20  ? ? Correction: Glasses  ? ?  ?  ? ? Tonometry (Tonopen, 11:06 AM)   ? ?  Right Left  ? Pressure 23 21  ? ?  ?  ? ? Pupils   ? ?   Pupils APD  ? Right PERRL None  ? Left PERRL None  ? ?  ?  ? ? Visual Fields   ? ?   Left Right  ?  Full Full  ? ?  ?  ? ? Extraocular Movement   ? ?   Right Left  ?  Full Full  ? ?  ?  ? ? Neuro/Psych   ? ? Oriented x3: Yes  ? Mood/Affect: Normal  ? ?  ?  ? ? Dilation   ? ? Both eyes: 1.0% Mydriacyl, 2.5% Phenylephrine @ 11:06 AM  ? ?  ?  ? ?  ? ?Slit Lamp and Fundus Exam   ? ? External Exam   ? ?   Right Left  ? External Normal Normal  ? ?  ?  ? ? Slit Lamp Exam   ? ?   Right Left  ? Lids/Lashes Normal Normal  ? Conjunctiva/Sclera White and quiet White and quiet  ? Cornea Clear Clear  ? Anterior Chamber Deep and quiet Deep and quiet  ? Iris Round and reactive Round and reactive  ? Lens Clear Clear  ? Anterior  Vitreous Normal Normal  ? ?  ?  ? ? Fundus Exam   ? ?   Right Left  ? Posterior Vitreous Normal Posterior vitreous detachment  ? Disc Normal Normal  ? C/D Ratio 0.05 0.15  ? Macula No thickening, scattered microaneurysms, dot blot hemorrhages temporal macula Normal  ? Vessels Overall much less intraretinal hemorrhage peripherally, scattered hemorrhages along the retinal vein along the superior aspect of the vein towards the equator superiorly as well as anterior retina Normal  ? Periphery Normal Normal, careful no holes or tears on exam  ? ?  ?  ? ?  ? ? ?IMAGING AND PROCEDURES  ?Imaging and Procedures for 09/25/21 ? ?OCT, Retina - OU - Both Eyes   ? ?   ?Right Eye ?Quality was good. Scan locations included subfoveal. Central Foveal Thickness: 288. Progression has improved. Findings include abnormal foveal contour.  ? ?Left Eye ?Quality was good. Scan locations included subfoveal. Central Foveal Thickness: 265. Findings include normal foveal contour.  ? ?Notes ?Minor macular thickening superior and region of BRVO not in the center, now vastly improved post Avastin No. 2 at currently nearly 12-week interval.  Repeat injection today and maintain 14-week follow-up ? ? ? ?  ? ?Intravitreal Injection, Pharmacologic Agent - OD - Right Eye   ? ?   ?Time Out ?09/25/2021. 11:23 AM. Confirmed correct patient, procedure, site, and patient consented.  ? ?Anesthesia ?Topical anesthesia was used. Anesthetic medications included Lidocaine 4%.  ? ?Procedure ?Preparation included Tobramycin 0.3%, Ofloxacin , 10% betadine to eyelids, 5% betadine to ocular surface. A 30 gauge needle was used.  ? ?Injection: ?2.5 mg bevacizumab 2.5 MG/0.1ML ?  Route: Intravitreal, Site: Right Eye ?  [place]: [id number], [id number]  ? ?Post-op ?Post injection exam found visual acuity of at least counting fingers. The patient tolerated the procedure well. There were no complications. The patient received written and verbal post procedure care education.  Post injection medications included ocuflox.  ? ?  ? ?Color Fundus Photography Optos - OU - Both Eyes   ? ?   ?Right Eye ?Progression has no prior data. Disc findings include normal observations. Macula : hemorrhage. Periphery : normal observations.  ? ?  Left Eye ?Progression has no prior data. Disc findings include normal observations. Macula : normal observations. Vessels : normal observations. Periphery : normal observations.  ? ?Notes ?Superior branch retinal vein occlusion with retinal hemorrhage encroaching into the superior portion of the macula not center involved.  Overall improved, retinal vein yet still with intraretinal hemorrhages superiorly and temporal to FAZ.  Will need repeat injection Avastin OD today and examination and recommended again in 3 months to monitor ? ?  ? ? ?  ?  ? ?  ?ASSESSMENT/PLAN: ? 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  Today at 31-month follow-up still with intraretinal hemorrhages temporally as well as along the retinal vein superiorly.  This suggest that the collateralization process is incomplete. ? ?We will repeat injection Avastin OD today to maintain momentum of improvement ? ?Snores ?Has schedule appointment to arrange for a formal sleep study tomorrow ? ?Posterior vitreous detachment of left eye ? The nature of posterior vitreous detachment was discussed with the patient as well as its physiology, its age prevalence, and its possible implication regarding retinal breaks and detachment.  An informational brochure was offered to the patient.  All the patient's questions were answered.  The patient was asked to return if new or different flashes or floaters develops.   Patient was instructed to contact office immediately if any new changes were noticed. ?I explained to the patient that vitreous inside the eye is similar to jello inside a bowl.  As the jello melts it can start to pull away from the bowl, similarly the vitreous throughout our lives can begin to pull away from the retina. That process is called a posterior vitreous detachment. In some

## 2021-09-25 NOTE — Assessment & Plan Note (Signed)
The nature of posterior vitreous detachment was discussed with the patient as well as its physiology, its age prevalence, and its possible implication regarding retinal breaks and detachment.  An informational brochure was offered to the patient.  All the patient's questions were answered.  The patient was asked to return if new or different flashes or floaters develops.   Patient was instructed to contact office immediately if any new changes were noticed. ?I explained to the patient that vitreous inside the eye is similar to jello inside a bowl. As the jello melts it can start to pull away from the bowl, similarly the vitreous throughout our lives can begin to pull away from the retina. That process is called a posterior vitreous detachment. In some cases, the vitreous can tug hard enough on the retina to form a retinal tear. I discussed with the patient the signs and symptoms of a retinal detachment.  Do not rub the eye. ? ?Floaters today no holes or tears ?

## 2021-09-25 NOTE — Assessment & Plan Note (Signed)
Has schedule appointment to arrange for a formal sleep study tomorrow ?

## 2021-09-25 NOTE — Assessment & Plan Note (Signed)
Vastly improved and maintained macular thickening right eye since onset of therapy some 8 months previous.  Today at 47-month follow-up still with intraretinal hemorrhages temporally as well as along the retinal vein superiorly.  This suggest that the collateralization process is incomplete. ? ?We will repeat injection Avastin OD today to maintain momentum of improvement ?

## 2021-09-25 NOTE — Progress Notes (Signed)
?Cardiology Clinic Note  ? ?Patient Name: Maria Robertson ?Date of Encounter: 09/26/2021 ? ?Primary Care Provider:  Redmond School, MD ?Primary Cardiologist:  Maria Casino, MD ? ?Patient Profile  ?  ?66 year old female with known history of hypertension, hyperlipidemia, coronary artery disease with stent to the left circumflex in 2005, follow-up catheterization in 2011 revealing no critical lesions, patent left circumflex stent.  Other history includes obesity.  Last encounter was a virtual visit with Maria Deforest, PA on 03/08/2020 at which time she was asymptomatic. ? ?Past Medical History  ?  ?Past Medical History:  ?Diagnosis Date  ? Coronary artery disease   ? a. 2005 MI/PCI: LCX (2.5x8 Cypher DES);  b. 2011 USA/Cath: stable anatomy & patent stent; c. 05/2014 ETT: Ex time 7 mins.  Neg study.  ? Dyslipidemia   ? Essential hypertension   ? GERD (gastroesophageal reflux disease)   ? Myocardial infarction [facility]) 2005  ? Obesity   ? ?Past Surgical History:  ?Procedure Laterality Date  ? CARDIAC CATHETERIZATION  2005  ? showed 80% stenosis in left circumflex, 2.5x46m Taxus drug-eluting stent   ? FRACTURE SURGERY    ? right foot  ? ? ?Allergies ? ?No Known Allergies ? ?History of Present Illness  ?  ?[name] a 66year old female patient we are following for CAD, hyperlipidemia, and hypertension.  Was last encountered virtually on 03/08/2020 and was asymptomatic. ? ?She comes today at the request of her PCP because blood pressure has remained elevated especially in the afternoons.  Blood pressure in the afternoons going as high as 150/80.  The patient also has had a retinal hemorrhage which is believed to be caused by elevated blood pressures.  She is being followed by retinal specialist.  It was also recommended by PCP that she be evaluated with a home sleep study to evaluate for sleep apnea.  She does admit to snoring. ? ?[place]  The  patient states that she does travel back and forth as she has children and grandchildren here. ? ?She has not had any labs completed and is requesting annual labs as well as refills on medications.  [facility] [place] ? ?Also, the patient has had some abdominal pain more on the left than on the right, she describes it as "my gallbladder attack".  It lasted approximately 5 hours and resolved on its own.  She did not seek medical treatment. ? ?Home Medications  ?  ?Current Outpatient Medications  ?Medication Sig Dispense Refill  ? aspirin 81 MG tablet Take 81 mg by mouth daily.    ? atorvastatin (LIPITOR) 20 MG tablet TAKE 1 TABLET BY MOUTH EVERY DAY 90 tablet 3  ? clopidogrel (PLAVIX) 75 MG tablet TAKE 1 TABLET BY MOUTH EVERY DAY 90 tablet 3  ? losartan (COZAAR) 25 MG tablet TAKE 1 TABLET BY MOUTH EVERY DAY 90 tablet 3  ? nebivolol (BYSTOLIC) 10 MG tablet TAKE 1 TABLET BY MOUTH EVERY DAY 90 tablet 0  ? nitroGLYCERIN (NITROSTAT) 0.4 MG SL tablet Place 1 tablet (0.4 mg total) under the tongue every 5 (five) minutes as needed for chest pain. 25 tablet 3  ? pantoprazole (PROTONIX) 40 MG tablet TAKE 1 TABLET BY MOUTH EVERY DAY 90 tablet 3  ? ?Current Facility-Administered Medications  ?Medication Dose Route Frequency Provider Last Rate Last Admin  ? nebivolol (  BYSTOLIC) tablet 10 mg  10 mg Oral Daily Maria Robertson, [place]      ?  ? ?Family History  ?  ?Family History  ?Problem Relation Age of Onset  ? Heart disease Father   ? Diabetes Mother   ? Hypertension Mother   ? Breast cancer Maternal Great-grandmother   ? ?She indicated that her mother is alive. She indicated that her father is deceased. She indicated that the status of her maternal great-grandmother is unknown. ? ?Social History  ?  ?Social History  ? ?Socioeconomic History  ? Marital status: Widowed  ?  Spouse name: Not on file  ? Number of children: 5  ? Years of education: Not on file  ? Highest  education level: Not on file  ?Occupational History  ? Not on file  ?Tobacco Use  ? Smoking status: Former  ?  Years: 20.00  ?  Types: Cigarettes  ?  Quit date: 01/16/2010  ?  Years since quitting: 11.7  ? Smokeless tobacco: Never  ?Substance and Sexual Activity  ? Alcohol use: No  ? Drug use: No  ? Sexual activity: Not on file  ?Other Topics Concern  ? Not on file  ?Social History Narrative  ? Not on file  ? ?Social Determinants of Health  ? ?Financial Resource Strain: Not on file  ?Food Insecurity: Not on file  ?Transportation Needs: Not on file  ?Physical Activity: Not on file  ?Stress: Not on file  ?Social Connections: Not on file  ?Intimate Partner Violence: Not on file  ?  ? ?Review of Systems  ?  ?General:  No chills, fever, night sweats or weight changes.  ?Cardiovascular:  No chest pain, dyspnea on exertion, edema, orthopnea, palpitations, paroxysmal nocturnal dyspnea. ?Dermatological: No rash, lesions/masses ?Respiratory: No cough, dyspnea ?Urologic: No hematuria, dysuria ?Abdominal:   No nausea, vomiting, diarrhea, bright red blood per rectum, melena, or hematemesis ?Neurologic:  No visual changes, wkns, changes in mental status. ?All other systems reviewed and are otherwise negative except as noted above. ? ? ?Physical Exam  ?  ?VS:  BP (!) 162/94   Pulse 61   Ht _0  (1.499 m)   Wt 189 lb (85.7 kg)   SpO2 97%   BMI 38.17 kg/m?  , BMI Body mass index is 38.17 kg/m?. ?    ?GEN: Well nourished, well developed, in no acute distress. ?HEENT: normal. ?Neck: Supple, no JVD, carotid bruits, or masses. ?Cardiac: RRR, no murmurs, rubs, or gallops. No clubbing, cyanosis, edema.  Radials/DP/PT 2+ and equal bilaterally.  ?Respiratory:  Respirations regular and unlabored, clear to auscultation bilaterally. ?GI: Soft, nontender, nondistended, BS + x 4.  Negative Murphy sign ?MS: no deformity or atrophy. ?Skin: warm and dry, no rash. ?Neuro:  Strength and sensation are intact. ?Psych: Normal affect. ? ?Accessory  Clinical Findings  ?  ?ECG personally reviewed by me today-normal sinus rhythm, heart rate 61 bpm, inferior Q waves are noted- No acute changes ? ?Lab Results  ?Component Value Date  ? WBC 9.1 09/25/2016  ? HGB 15.3 09/25/2016  ? HCT 43.6 09/25/2016  ? MCV 89 09/25/2016  ? PLT 256 09/25/2016  ? ?Lab Results  ?Component Value Date  ? CREATININE 0.85 09/25/2016  ? BUN 23 09/25/2016  ? NA 141 09/25/2016  ? K 4.3 09/25/2016  ? CL 100 09/25/2016  ? CO2 25 09/25/2016  ? ?Lab Results  ?Component Value Date  ? ALT 20 09/25/2016  ? AST 16 09/25/2016  ? ALKPHOS  107 09/25/2016  ? BILITOT 2.0 (H) 09/25/2016  ? ?Lab Results  ?Component Value Date  ? CHOL 138 12/13/2017  ? HDL 55 12/13/2017  ? [place] 66 12/13/2017  ? TRIG 83 12/13/2017  ? CHOLHDL 2.5 12/13/2017  ?  ?No results found for: HGBA1C ? ?Review of Prior Studies: ?Please see scanned reports for NM study and Cardiac cath. ? ?Assessment & Plan  ? ?1.  Hypertension: Not well controlled with elevations in the afternoon.  We will increase her losartan from 25 mg daily to 50 mg daily.  She is to continue to monitor blood pressure and record.  She is advised to find a primary care physician in [place] so that ongoing management if necessary can be completed and blood pressure can be monitored.  She is to record her blood pressures at the same time every day in the afternoon.  She is given parameters of less than 110/50 as hypotensive, and blood pressures 155/90 as too high.  She is to avoid salt.  Prescriptions are sent to pharmacy in [place].  C-Met and CBC are ordered. ? ?2.  Rule out OSA: STOP BANG score of 7.  Order has been written and prescription written to have home sleep study.  She is to take this to her primary care physician up in [place] where she can have this completed.  If not completed while in [place], may need to consider doing this when she is back home in [place] for a period of time. ? ?3.  Coronary artery disease: History of PCI to the circumflex  in 2005.  She remains on dual antiplatelet therapy.  Can consider taking her off of clopidogrel as she is having subretinal bleeding but would like to have her blood pressure better controlled prior to considering

## 2021-09-26 ENCOUNTER — Encounter: Payer: Self-pay | Admitting: Adult Health

## 2021-09-26 ENCOUNTER — Other Ambulatory Visit: Payer: Self-pay

## 2021-09-26 ENCOUNTER — Ambulatory Visit (INDEPENDENT_AMBULATORY_CARE_PROVIDER_SITE_OTHER): Payer: Medicare Other | Admitting: Adult Health

## 2021-09-26 VITALS — BP 162/94 | HR 61 | Ht 59.0 in | Wt 189.0 lb

## 2021-09-26 DIAGNOSIS — I2510 Atherosclerotic heart disease of native coronary artery without angina pectoris: Secondary | ICD-10-CM

## 2021-09-26 DIAGNOSIS — I2583 Coronary atherosclerosis due to lipid rich plaque: Secondary | ICD-10-CM | POA: Diagnosis not present

## 2021-09-26 DIAGNOSIS — E785 Hyperlipidemia, unspecified: Secondary | ICD-10-CM

## 2021-09-26 DIAGNOSIS — Z79899 Other long term (current) drug therapy: Secondary | ICD-10-CM

## 2021-09-26 DIAGNOSIS — G4733 Obstructive sleep apnea (adult) (pediatric): Secondary | ICD-10-CM

## 2021-09-26 MED ORDER — LOSARTAN POTASSIUM 50 MG PO TABS: 50.0000 mg | ORAL_TABLET | Freq: Every day | ORAL | 3 refills | Status: DC

## 2021-09-26 MED ORDER — NEBIVOLOL HCL 10 MG PO TABS: 10.0000 mg | ORAL_TABLET | Freq: Every day | ORAL | 3 refills | Status: DC

## 2021-09-26 NOTE — Patient Instructions (Signed)
Medication Instructions:  ?INCREASE Losartan 50 mg daily  ? ?*If you need a refill on your cardiac medications before your next appointment, please call your pharmacy* ? ? ?Lab Work: ?Your physician recommends that you complete labs today ?Lipid ?CMET ?CBC ?Lipase ? ?If you have labs (blood work) drawn today and your tests are completely normal, you will receive your results only by: ?MyChart Message (if you have MyChart) OR ?A paper copy in the mail ?If you have any lab test that is abnormal or we need to change your treatment, we will call you to review the results. ? ? ?Testing/Procedures: ?Sleep Study, Adult ?A sleep study (polysomnogram) is a series of tests done while you are sleeping. A sleep study records your brain waves, heart rate, breathing rate, oxygen level, and eye and leg movements. ?A sleep study helps your health care provider: ?See how well you sleep. ?Diagnose a sleep disorder. ?Determine how severe your sleep disorder is. ?Create a plan to treat your sleep disorder. ?Your health care provider may recommend a sleep study if you: ?Feel sleepy on most days. ?Snore loudly while sleeping. ?Have unusual behaviors while you sleep, such as walking. ?Have brief periods in which you stop breathing during sleep (sleepapnea). ?Fall asleep suddenly during the day (narcolepsy). ?Have trouble falling asleep or staying asleep (insomnia). ?Feel like you need to move your legs when trying to fall asleep (restless legs syndrome). ?Move your legs by flexing and extending them regularly while asleep (periodic limb movement disorder). ?Act out your dreams while you sleep (sleep behavior disorder). ?Feel like you cannot move when you first wake up (sleep paralysis). ?What tests are part of a sleep study? ?Most sleep studies record the following during sleep: ?Brain activity. ?Eye movements. ?Heart rate and rhythm. ?Breathing rate and rhythm. ?Blood-oxygen level. ?Blood pressure. ?Chest and belly movement as you  breathe. ?Arm and leg movements. ?Snoring or other noises. ?Body position. ?Where are sleep studies done? ?Sleep studies are done at sleep centers. A sleep center may be inside a hospital, office, or clinic. ?The room where you have the study may look like a hospital room or a hotel room. The health care providers doing the study may come in and out of the room during the study. Most of the time, they will be in another room monitoring your test as you sleep. ?How are sleep studies done? ?Most sleep studies are done during a normal period of time for a full night of sleep. You will arrive at the study center in the evening and go home in the morning. ?Before the test ?Bring your pajamas and toothbrush with you to the sleep study. ?Do not have caffeine on the day of your sleep study. ?Do not drink alcohol on the day of your sleep study. ?Your health care provider will let you know if you should stop taking any of your regular medicines before the test. ?During the test ? ?  ? ?Round, sticky patches with sensors attached to recording wires (electrodes) are placed on your scalp, face, chest, and limbs. ?Wires from all the electrodes and sensors run from your bed to a computer. The wires can be taken off and put back on if you need to get out of bed to go to the bathroom. ?A sensor is placed over your nose to measure airflow. ?A finger clip is put on your finger or ear to measure your blood oxygen level (pulse oximetry). ?A belt is placed around your belly and a belt  is placed around your chest to measure breathing movements. ?If you have signs of the sleep disorder called sleep apnea during your test, you may get a treatment mask to wear for the second half of the night. ?The mask provides positive airway pressure (PAP) to help you breathe better during sleep. This may greatly improve your sleep apnea. ?You will then have all tests done again with the mask in place to see if your measurements and recordings  change. ?After the test ?A medical doctor who specializes in sleep will evaluate the results of your sleep study and share them with you and your primary health care provider. ?Based on your results, your medical history, and a physical exam, you may be diagnosed with a sleep disorder, such as: ?Sleep apnea. ?Restless legs syndrome. ?Sleep-related behavior disorder. ?Sleep-related movement disorders. ?Sleep-related seizure disorders. ?Your health care team will help determine your treatment options based on your diagnosis. This may include: ?Improving your sleep habits (sleep hygiene). ?Wearing a continuous positive airway pressure (CPAP) or bi-level positive airway pressure (BIPAP) mask. ?Wearing an oral device at night to improve breathing and reduce snoring. ?Taking medicines. ?Follow these instructions at home: ?Take over-the-counter and prescription medicines only as told by your health care provider. ?If you are instructed to use a CPAP or BIPAP mask, make sure you use it nightly as directed. ?Make any lifestyle changes that your health care provider recommends. ?If you were given a device to open your airway while you sleep, use it only as told by your health care provider. ?Do not use any tobacco products, such as cigarettes, chewing tobacco, and e-cigarettes. If you need help quitting, ask your health care provider. ?Keep all follow-up visits as told by your health care provider. This is important. ?Summary ?A sleep study (polysomnogram) is a series of tests done while you are sleeping. It shows how well you sleep. ?Most sleep studies are done over one full night of sleep. You will arrive at the study center in the evening and go home in the morning. ?If you have signs of the sleep disorder called sleep apnea during your test, you may get a treatment mask to wear for the second half of the night. ?A medical doctor who specializes in sleep will evaluate the results of your sleep study and share them with  your primary health care provider. ?This information is not intended to replace advice given to you by your health care provider. Make sure you discuss any questions you have with your health care provider. ?Document Revised: 03/18/2021 Document Reviewed: 03/03/2021 ?Elsevier Patient Education ? 2023 Elsevier Inc[employer] ? ? ? ?Follow-Up: ?At [facility], you and your health needs are our priority.  As part of our continuing mission to provide you with exceptional heart care, we have created designated Provider Care Teams.  These Care Teams include your primary Cardiologist (physician) and Advanced Practice Providers (APPs -  Physician Assistants and Nurse Practitioners) who all work together to provide you with the care you need, when you need it. ? ?We recommend signing up for the patient portal called "MyChart".  Sign up information is provided on this After Visit Summary.  MyChart is used to connect with patients for Virtual Visits (Telemedicine).  Patients are able to view lab/test results, encounter notes, upcoming appointments, etc.  Non-urgent messages can be sent to your provider as well.   ?To learn more about what you can do with MyChart, go to [email or web address].   ? ?Your next appointment:   ?  6 month(s) ? ?The format for your next appointment:   ?In Person ? ?Provider:   ?Chrystie Nose, MD   ? ? ?Other Instructions ? ? ?Important Information About Sugar ? ? ? ? ? ? ?

## 2021-09-27 LAB — LIPID PANEL
Chol/HDL Ratio: 2.5 ratio (ref 0.0–4.4)
Cholesterol, Total: 162 mg/dL (ref 100–199)
HDL: 64 mg/dL
LDL Chol Calc (NIH): 81 mg/dL (ref 0–99)
Triglycerides: 95 mg/dL (ref 0–149)
VLDL Cholesterol Cal: 17 mg/dL (ref 5–40)

## 2021-09-27 LAB — CBC
Hematocrit: 44.9 % (ref 34.0–46.6)
Hemoglobin: 15.4 g/dL (ref 11.1–15.9)
MCH: 31.2 pg (ref 26.6–33.0)
MCHC: 34.3 g/dL (ref 31.5–35.7)
MCV: 91 fL (ref 79–97)
Platelets: 245 x10E3/uL (ref 150–450)
RBC: 4.94 x10E6/uL (ref 3.77–5.28)
RDW: 12.8 % (ref 11.7–15.4)
WBC: 8.9 x10E3/uL (ref 3.4–10.8)

## 2021-09-27 LAB — COMPREHENSIVE METABOLIC PANEL WITH GFR
ALT: 18 IU/L (ref 0–32)
AST: 16 IU/L (ref 0–40)
Albumin/Globulin Ratio: 1.8 (ref 1.2–2.2)
Albumin: 4.4 g/dL (ref 3.8–4.8)
Alkaline Phosphatase: 112 IU/L (ref 44–121)
BUN/Creatinine Ratio: 20 (ref 12–28)
BUN: 15 mg/dL (ref 8–27)
Bilirubin Total: 0.8 mg/dL (ref 0.0–1.2)
CO2: 25 mmol/L (ref 20–29)
Calcium: 9.3 mg/dL (ref 8.7–10.3)
Chloride: 102 mmol/L (ref 96–106)
Creatinine, Ser: 0.75 mg/dL (ref 0.57–1.00)
Globulin, Total: 2.4 g/dL (ref 1.5–4.5)
Glucose: 102 mg/dL — ABNORMAL HIGH (ref 70–99)
Potassium: 4.4 mmol/L (ref 3.5–5.2)
Sodium: 140 mmol/L (ref 134–144)
Total Protein: 6.8 g/dL (ref 6.0–8.5)
eGFR: 88 mL/min/1.73

## 2021-09-27 LAB — LIPASE: Lipase: 25 U/L (ref 14–72)

## 2021-09-30 ENCOUNTER — Telehealth: Payer: Self-pay

## 2021-09-30 NOTE — Telephone Encounter (Addendum)
Called patient regarding results. Patient had understanding of results.Patient advised records mailed on 5/12.----- Message from Jodelle Gross, NP sent at 09/29/2021 10:37 AM EDT ----- ?I have reviewed her labs. No abnormal levels are seen.  Slightly elevated blood glucose  but now significant. She will follow up in [place] for her sleep study once she is established with a PCP. Records were mailed on Friday. [place] ?

## 2021-11-15 IMAGING — MG DIGITAL SCREENING BILAT W/ TOMO W/ CAD
8 series · 8 of 24 positions shown · non-contrast
Comparison: Previous exam(s).

CLINICAL DATA: Screening.

EXAM:
DIGITAL SCREENING BILATERAL MAMMOGRAM WITH TOMO AND CAD

[L CC synth-2D]
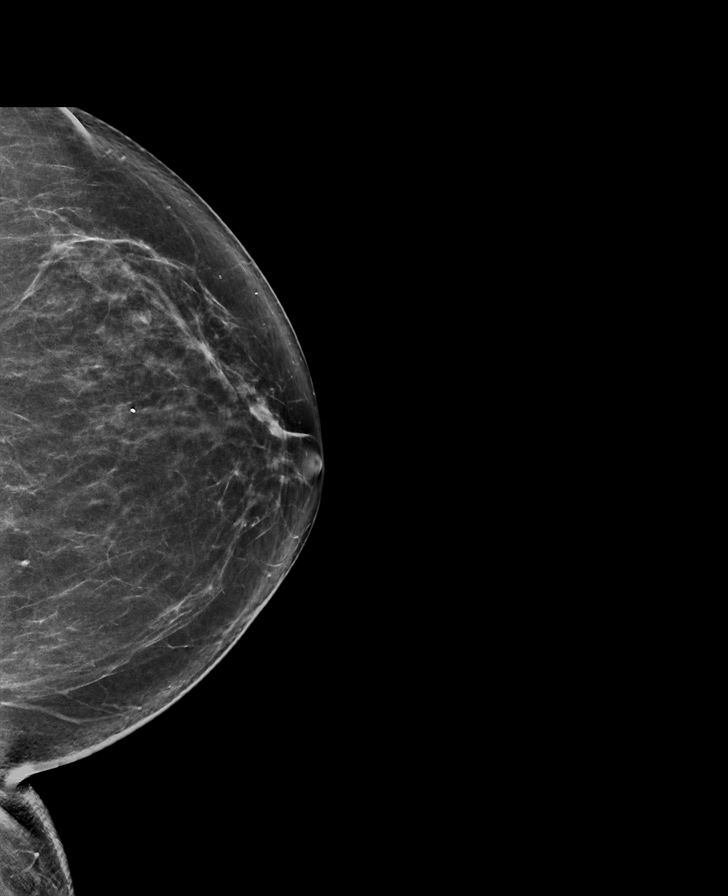

[R MLO synth-2D]
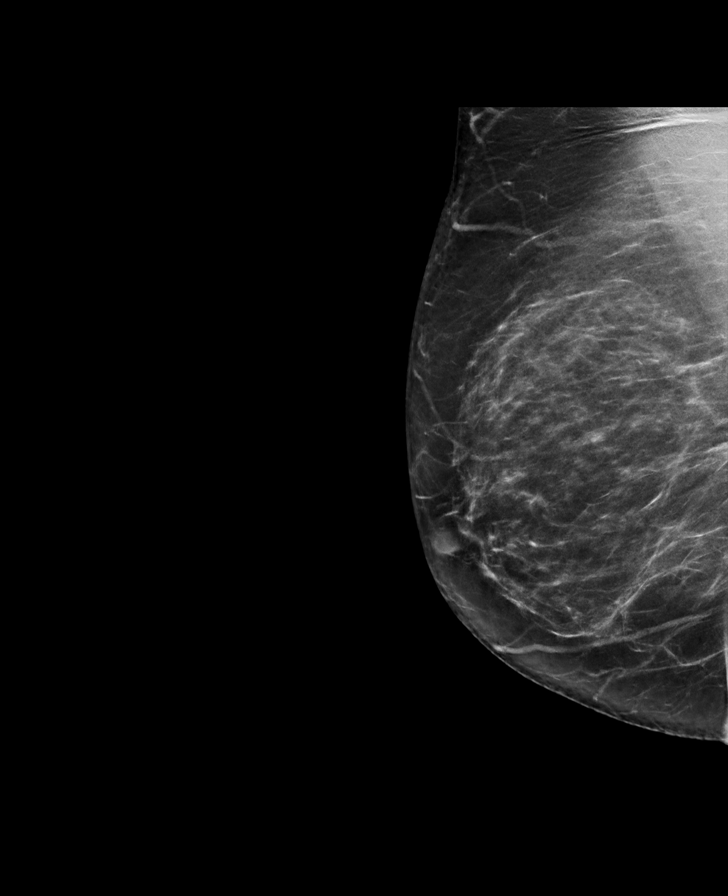

[R CC synth-2D]
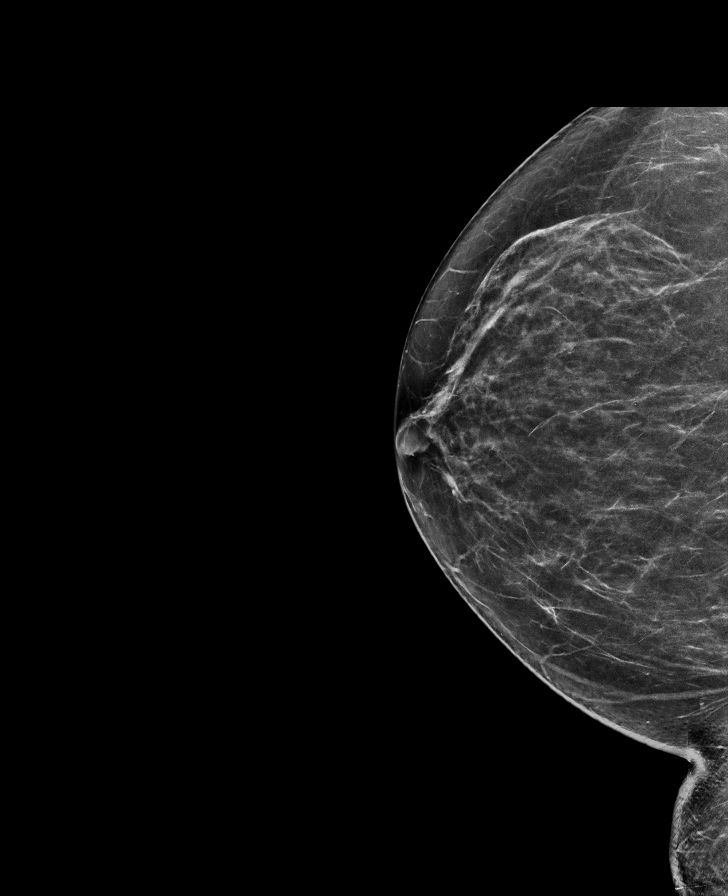

[L MLO synth-2D]
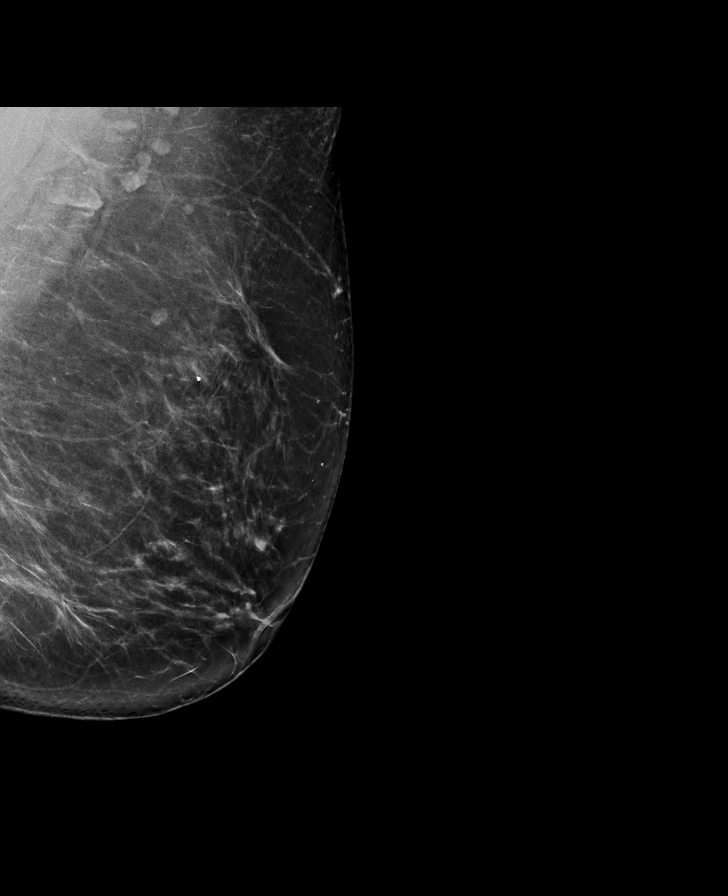

[R MLO tomo · tomo slice 39/78.0]
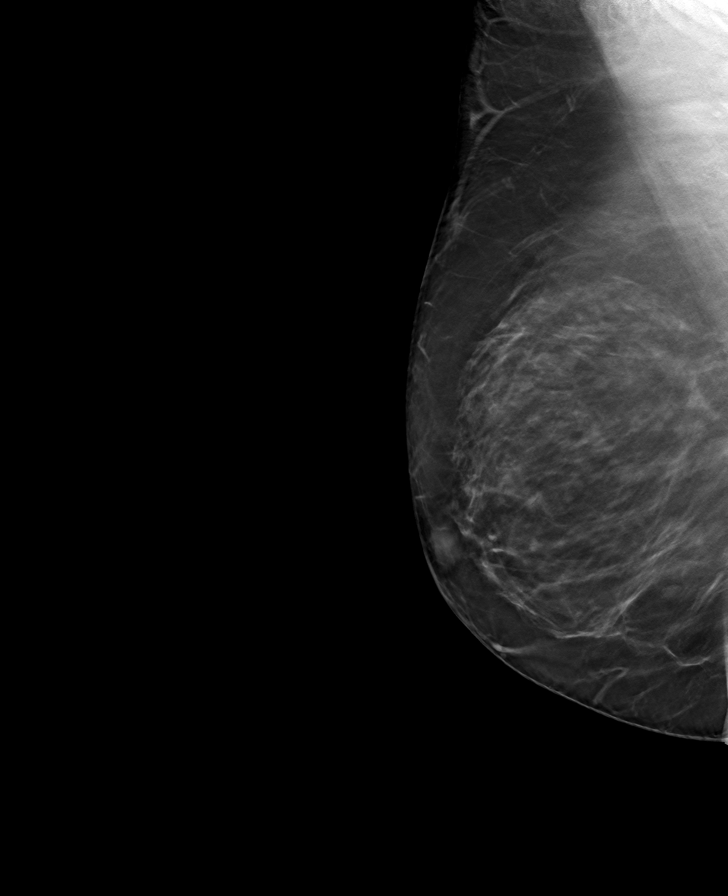

[R CC tomo · tomo slice 37/74.0]
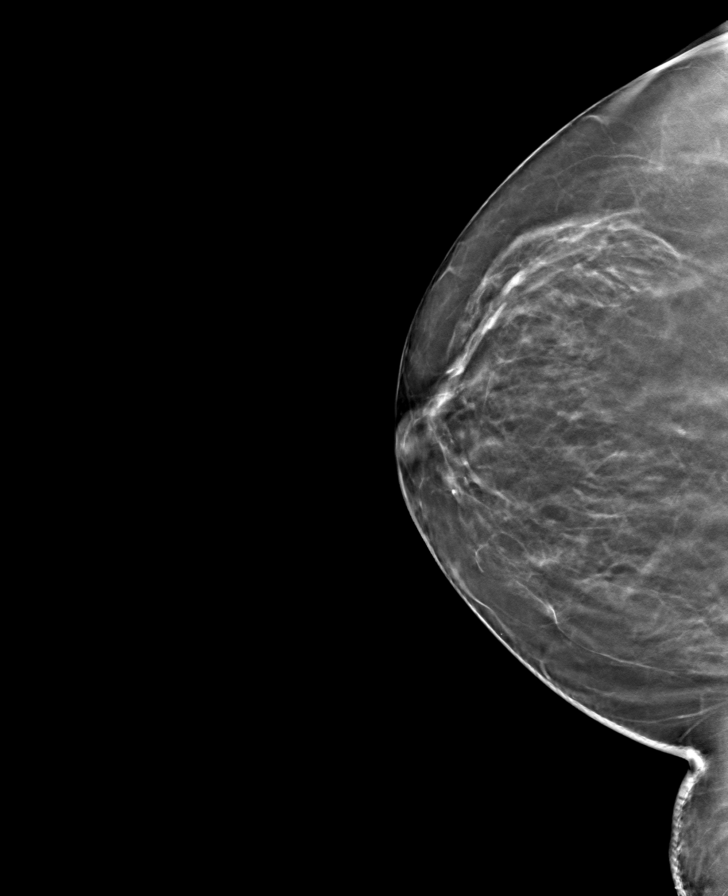

[L CC tomo · tomo slice 39/77.0]
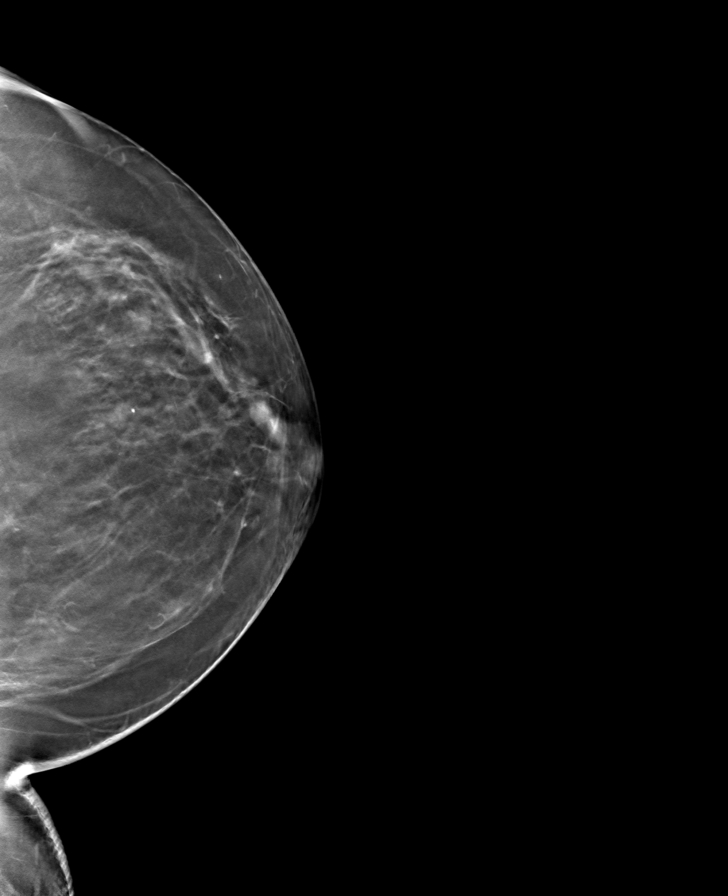

[L MLO tomo · tomo slice 43/85.0]
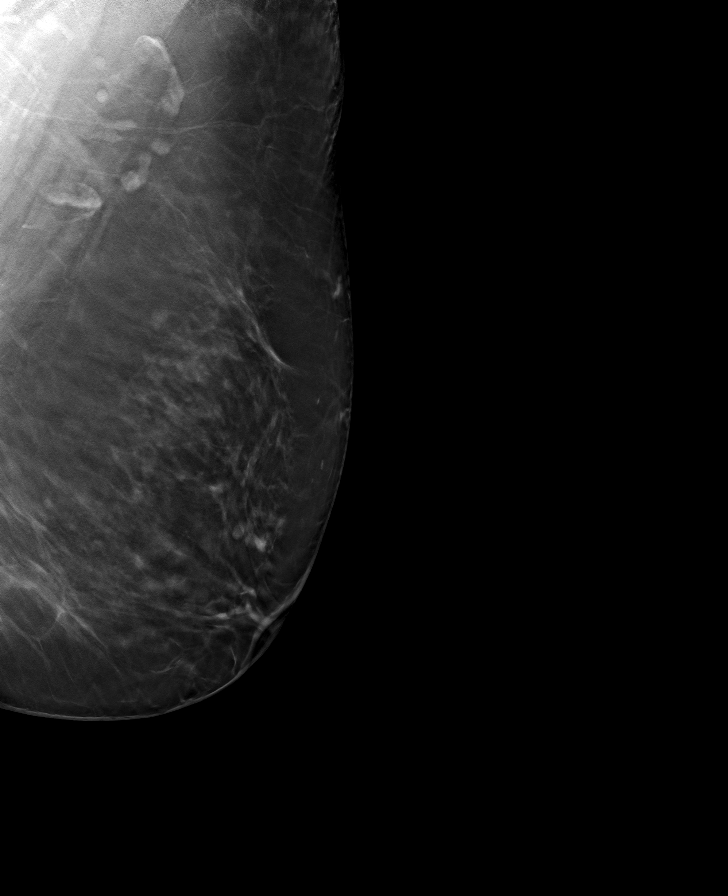

[8 of 24 positions shown; findings below may reference images not displayed]

ACR Breast Density Category b: There are scattered areas of
fibroglandular density.
FINDINGS: There are no findings suspicious for malignancy. Images were
processed with CAD.
IMPRESSION: No mammographic evidence of malignancy. A result letter of this
screening mammogram will be mailed directly to the patient.

RECOMMENDATION:
Screening mammogram in one year. (Code:[id number])

BI-RADS CATEGORY  1: Negative.

## 2021-12-29 ENCOUNTER — Encounter (INDEPENDENT_AMBULATORY_CARE_PROVIDER_SITE_OTHER): Payer: Self-pay | Admitting: Ophthalmology

## 2021-12-29 ENCOUNTER — Ambulatory Visit (INDEPENDENT_AMBULATORY_CARE_PROVIDER_SITE_OTHER): Payer: Medicare Other | Admitting: Ophthalmology

## 2021-12-29 DIAGNOSIS — I2510 Atherosclerotic heart disease of native coronary artery without angina pectoris: Secondary | ICD-10-CM

## 2021-12-29 DIAGNOSIS — I2583 Coronary atherosclerosis due to lipid rich plaque: Secondary | ICD-10-CM

## 2021-12-29 DIAGNOSIS — H2513 Age-related nuclear cataract, bilateral: Secondary | ICD-10-CM | POA: Diagnosis not present

## 2021-12-29 DIAGNOSIS — H34831 Tributary (branch) retinal vein occlusion, right eye, with macular edema: Secondary | ICD-10-CM

## 2021-12-29 DIAGNOSIS — H35371 Puckering of macula, right eye: Secondary | ICD-10-CM

## 2021-12-29 MED ORDER — BEVACIZUMAB CHEMO INJECTION 1.25MG/0.05ML SYRINGE FOR KALEIDOSCOPE
1.2500 mg | INTRAVITREAL | Status: AC | PRN
  Administered 2021-12-29: 1.25 mg via INTRAVITREAL

## 2021-12-29 NOTE — Assessment & Plan Note (Signed)
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  The patient was given access to printed information.  The treatment options including continued observation looking for spontaneous resolution versus grid laser versus intravitreal Kenalog injection were discussed.  PRIMARY THERAPY CONSISTS of Anti-VEGF Therapies, AVASTIN, LUCENTIS AND EYLEA.  Their usage was discussed to assist in halting the progression of Macular Edema, in order to preserve, protect or improve acuity.  Additionally, at times, limited focal laser therapy is used in the management.  The risks and benefits of all these options were discussed with the patient.  The patient's questions were answered. 

## 2021-12-29 NOTE — Assessment & Plan Note (Signed)
Minor at this time observe 

## 2021-12-29 NOTE — Progress Notes (Signed)
12/29/2021     CHIEF COMPLAINT Patient presents for  Chief Complaint  Patient presents with   Branch Retinal Vein Occlusion      HISTORY OF PRESENT ILLNESS: Maria Robertson is a 66 y.o. female who presents to the clinic today for:   HPI   3 MOS for DILATE OU, OCT, Possible, AVASTIN OCT, OD. Pt stated vision has been stable since last visit.    Last edited by Angeline Slim on 12/29/2021 10:48 AM.      Referring physician: Smitty Cords, OD [address] [place],  [place] [address]  HISTORICAL INFORMATION:   Selected notes from the MEDICAL RECORD NUMBER       CURRENT MEDICATIONS: No current outpatient medications on file. (Ophthalmic Drugs)   No current facility-administered medications for this visit. (Ophthalmic Drugs)   Current Outpatient Medications (Other)  Medication Sig   aspirin 81 MG tablet Take 81 mg by mouth daily.   atorvastatin (LIPITOR) 20 MG tablet TAKE 1 TABLET BY MOUTH EVERY DAY   clopidogrel (PLAVIX) 75 MG tablet TAKE 1 TABLET BY MOUTH EVERY DAY   losartan (COZAAR) 50 MG tablet Take 1 tablet (50 mg total) by mouth daily.   nebivolol (BYSTOLIC) 10 MG tablet TAKE 1 TABLET BY MOUTH EVERY DAY   nebivolol (BYSTOLIC) 10 MG tablet Take 1 tablet (10 mg total) by mouth daily.   nitroGLYCERIN (NITROSTAT) 0.4 MG SL tablet Place 1 tablet (0.4 mg total) under the tongue every 5 (five) minutes as needed for chest pain.   pantoprazole (PROTONIX) 40 MG tablet TAKE 1 TABLET BY MOUTH EVERY DAY   Current Facility-Administered Medications (Other)  Medication Route   nebivolol (BYSTOLIC) tablet 10 mg Oral      REVIEW OF SYSTEMS: ROS   Negative for: Constitutional, Gastrointestinal, Neurological, Skin, Genitourinary, Musculoskeletal, HENT, Endocrine, Cardiovascular, Eyes, Respiratory, Psychiatric, Allergic/Imm, Heme/Lymph Last edited by Angeline Slim on 12/29/2021 10:48 AM.       ALLERGIES No Known Allergies  PAST MEDICAL HISTORY Past Medical History:  Diagnosis Date    Coronary artery disease    a. 2005 MI/PCI: LCX (2.5x8 Cypher DES);  b. 2011 USA/Cath: stable anatomy & patent stent; c. 05/2014 ETT: Ex time 7 mins.  Neg study.   Dyslipidemia    Essential hypertension    GERD (gastroesophageal reflux disease)    Myocardial infarction (HCC) 2005   Obesity    Past Surgical History:  Procedure Laterality Date   CARDIAC CATHETERIZATION  2005   showed 80% stenosis in left circumflex, 2.5x39mm Taxus drug-eluting stent    FRACTURE SURGERY     right foot    FAMILY HISTORY Family History  Problem Relation Age of Onset   Heart disease Father    Diabetes Mother    Hypertension Mother    Breast cancer Maternal Great-grandmother     SOCIAL HISTORY Social History   Tobacco Use   Smoking status: Former    Years: 20.00    Types: Cigarettes    Quit date: 01/16/2010    Years since quitting: 11.9   Smokeless tobacco: Never  Substance Use Topics   Alcohol use: No   Drug use: No         OPHTHALMIC EXAM:  Base Eye Exam     Visual Acuity (ETDRS)       Right Left   Dist cc 20/25 +2 20/25 -2    Correction: Glasses         Tonometry (Tonopen, 10:55 AM)  Right Left   Pressure 15 16         Pupils       Pupils APD   Right PERRL None   Left PERRL None         Extraocular Movement       Right Left    Full, Ortho Full, Ortho         Neuro/Psych     Oriented x3: Yes   Mood/Affect: Normal         Dilation     Both eyes: 1.0% Mydriacyl, 2.5% Phenylephrine @ 10:55 AM           Slit Lamp and Fundus Exam     External Exam       Right Left   External Normal Normal         Slit Lamp Exam       Right Left   Lids/Lashes Normal Normal   Conjunctiva/Sclera White and quiet White and quiet   Cornea Clear Clear   Anterior Chamber Deep and quiet Deep and quiet   Iris Round and reactive Round and reactive   Lens 2+ Nuclear sclerosis Clear   Anterior Vitreous Normal Normal         Fundus Exam        Right Left   Posterior Vitreous Normal Posterior vitreous detachment   Disc Normal, peripapillary hemorrhage superiorly along the arcades signifying ongoing BRVO Normal   C/D Ratio 0.05 0.15   Macula No thickening, scattered microaneurysms, dot blot hemorrhages temporal macula Normal   Vessels Overall much less intraretinal hemorrhage peripherally, scattered hemorrhages along the retinal vein along the superior aspect of the vein towards the equator superiorly as well as anterior retina Normal   Periphery Normal Normal, careful no holes or tears on exam            IMAGING AND PROCEDURES  Imaging and Procedures for 12/29/21  OCT, Retina - OU - Both Eyes       Right Eye Quality was good. Scan locations included subfoveal. Central Foveal Thickness: 296. Progression has improved. Findings include abnormal foveal contour, cystoid macular edema, epiretinal membrane.   Left Eye Quality was good. Scan locations included subfoveal. Central Foveal Thickness: 269. Findings include normal foveal contour.   Notes Minor macular thickening temporally with CME region of BRVO not in the center, residual thickening superiorly from ERM.  Now vastly improved post Avastin at currently nearly 12-week interval.  Repeat injection today and maintain 14-week follow-up       Intravitreal Injection, Pharmacologic Agent - OD - Right Eye       Time Out 12/29/2021. 11:26 AM. Confirmed correct patient, procedure, site, and patient consented.   Anesthesia Topical anesthesia was used. Anesthetic medications included Lidocaine 4%.   Procedure Preparation included 5% betadine to ocular surface, 10% betadine to eyelids, Tobramycin 0.3%, Ofloxacin . A 30 gauge needle was used.   Injection: 1.25 mg Bevacizumab 1.25mg /0.71ml   Route: Intravitreal, Site: Right Eye   NDC: [id number], Lot: [id number], Expiration date: 03/20/2022   Post-op Post injection exam found visual acuity of at least counting  fingers. The patient tolerated the procedure well. There were no complications. The patient received written and verbal post procedure care education. Post injection medications included ocuflox.              ASSESSMENT/PLAN:  Branch retinal vein occlusion with macular edema of right eye The nature of branch retinal vein occlusion with macular edema was discussed.  The patient was given access to printed information.  The treatment options including continued observation looking for spontaneous resolution versus grid laser versus intravitreal Kenalog injection were discussed.  PRIMARY THERAPY CONSISTS of Anti-VEGF Therapies, AVASTIN, LUCENTIS AND EYLEA.  Their usage was discussed to assist in halting the progression of Macular Edema, in order to preserve, protect or improve acuity.  Additionally, at times, limited focal laser therapy is used in the management.  The risks and benefits of all these options were discussed with the patient.  The patient's questions were answered.  Cataract, nuclear sclerotic, both eyes OU, accounts for some visual difficulty.  Option at a time for cataract surgery in each eye  Epiretinal membrane, right eye Minor at this time observe     ICD-10-CM   1. 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 Cataract, nuclear sclerotic, both eyes  H25.13     3. Epiretinal membrane, right eye  H35.371       1.  OD, with CME secondary to BRVO.  Improved overall and stable at 3 months.  But clinically some evidence of BRVO continue to be recurrent, will need to repeat Avastin today to stabilize and follow-up next  2.  Epiretinal membrane OD no impact on acuity will observe  3.  Bilateral cataract OD and OS, option for surgery at any time  Ophthalmic Meds Ordered this visit:  Meds ordered this encounter   Medications   Bevacizumab (AVASTIN) SOLN 1.25 mg       Return in about 3 months (around 03/31/2022) for dilate, OD, COLOR FP, AVASTIN OCT.  There are no Patient Instructions on file for this visit.   Explained the diagnoses, plan, and follow up with the patient and they expressed understanding.  Patient expressed understanding of the importance of proper follow up care.   Alford Highland Odilia Damico M.D. Diseases & Surgery of the Retina and Vitreous [facility] 12/29/21     Abbreviations: M myopia (nearsighted); A astigmatism; H hyperopia (farsighted); P presbyopia; Mrx spectacle prescription;  CTL contact lenses; OD right eye; OS left eye; OU both eyes  XT exotropia; ET esotropia; PEK punctate epithelial keratitis; PEE punctate epithelial erosions; DES dry eye syndrome; MGD meibomian gland dysfunction; ATs artificial tears; PFAT's preservative free artificial tears; NSC nuclear sclerotic cataract; PSC posterior subcapsular cataract; ERM epi-retinal membrane; PVD posterior vitreous detachment; RD retinal detachment; DM diabetes mellitus; DR diabetic retinopathy; NPDR non-proliferative diabetic retinopathy; PDR proliferative diabetic retinopathy; CSME clinically significant macular edema; DME diabetic macular edema; dbh dot blot hemorrhages; CWS cotton wool spot; POAG primary open angle glaucoma; C/D cup-to-disc ratio; HVF humphrey visual field; GVF goldmann visual field; OCT optical coherence tomography; IOP intraocular pressure; BRVO Branch retinal vein occlusion; CRVO central retinal vein occlusion; CRAO central retinal artery occlusion; BRAO branch retinal artery occlusion; RT retinal tear; SB scleral buckle; PPV pars plana vitrectomy; VH Vitreous hemorrhage; PRP panretinal laser photocoagulation; IVK intravitreal kenalog; VMT vitreomacular traction; MH Macular hole;  NVD neovascularization of the disc; NVE neovascularization elsewhere; AREDS age related eye disease study; ARMD age  related macular degeneration; POAG primary open angle glaucoma; EBMD epithelial/anterior basement membrane dystrophy; ACIOL anterior chamber intraocular lens; IOL intraocular lens; PCIOL posterior chamber intraocular lens; Phaco/IOL phacoemulsification with intraocular lens placement; PRK photorefractive keratectomy; LASIK laser assisted  in situ keratomileusis; HTN hypertension; DM diabetes mellitus; COPD chronic obstructive pulmonary disease

## 2021-12-29 NOTE — Assessment & Plan Note (Signed)
OU, accounts for some visual difficulty.  Option at a time for cataract surgery in each eye

## 2022-03-30 ENCOUNTER — Encounter: Payer: Self-pay | Admitting: Internal Medicine

## 2022-03-30 ENCOUNTER — Ambulatory Visit: Payer: Medicare Other | Attending: Internal Medicine | Admitting: Internal Medicine

## 2022-03-30 VITALS — BP 166/94 | HR 76 | Ht <= 58 in | Wt 196.8 lb

## 2022-03-30 DIAGNOSIS — I10 Essential (primary) hypertension: Secondary | ICD-10-CM | POA: Insufficient documentation

## 2022-03-30 DIAGNOSIS — E785 Hyperlipidemia, unspecified: Secondary | ICD-10-CM | POA: Insufficient documentation

## 2022-03-30 DIAGNOSIS — I2510 Atherosclerotic heart disease of native coronary artery without angina pectoris: Secondary | ICD-10-CM | POA: Insufficient documentation

## 2022-03-30 DIAGNOSIS — I2583 Coronary atherosclerosis due to lipid rich plaque: Secondary | ICD-10-CM | POA: Insufficient documentation

## 2022-03-30 NOTE — Patient Instructions (Addendum)
Medication Instructions:  STOP Plavix   *If you need a refill on your cardiac medications before your next appointment, please call your pharmacy*  Follow-Up: At [facility], you and your health needs are our priority.  As part of our continuing mission to provide you with exceptional heart care, we have created designated Provider Care Teams.  These Care Teams include your primary Cardiologist (physician) and Advanced Practice Providers (APPs -  Physician Assistants and Nurse Practitioners) who all work together to provide you with the care you need, when you need it.  We recommend signing up for the patient portal called "MyChart".  Sign up information is provided on this After Visit Summary.  MyChart is used to connect with patients for Virtual Visits (Telemedicine).  Patients are able to view lab/test results, encounter notes, upcoming appointments, etc.  Non-urgent messages can be sent to your provider as well.   To learn more about what you can do with MyChart, go to [email or web address].    Your next appointment:   6 month(s)  The format for your next appointment:   In Person  Provider:   Chrystie Nose, MD      We will send you to our pharmacy team to discuss weight loss medication options.

## 2022-03-30 NOTE — Progress Notes (Signed)
OFFICE NOTE  Chief Complaint:  Routine follow-up  Primary Care Physician: Elfredia Nevins, MD  HPI:  Maria Robertson is a 66 year old female who was previously followed by Dr. Lynnea Ferrier with a history of myocardial infarction and a stent to the left circumflex in 09-04-03 which was a 2.5 x 8 mm Cypher stent. She underwent cardiac catheterization again in 2009-09-03 after the death of her husband who died suddenly of heart disease, and she was having chest pain at that time. This demonstrated no other significant coronary disease, and the stent to the circumflex was patent. She also has dyslipidemia, reflux, and obesity. She has no specific complaints today. She's any refills of her medications. She has again found work at a [employer], [employer]. She has had some weight gain and realizes that she needs to start to get more exercise.  Maria Robertson returns for follow-up today. Recently she was on vacation in [place]. She denies any chest pain or worsening shortness of breath. Blood pressures been running a little bit higher than usual. She has unfortunate gained another 5 pounds which makes about 20 pound weight gain over the last year. She realizes this and is going to get started on weight loss. She's had her cholesterol tested through her primary care physician and is told that it is within normal limits. Finally when pressed about physical activity she reports she does have a little bit of chest pressure with marked activity, especially when walking up hills. She says this limits her activity to some extent and she discontinues her walking. This pressure does not radiate to her neck or arm as it did previously prior to her heart attack but is somewhat reminiscent of those symptoms. She is interested in starting a more intense exercise program.  Maria Robertson returns today for follow-up. She is asymptomatic and denies any chest pain or worsening shortness breath. Her blood pressure is  actually well controlled today. She says the cost of her nebivolol has gone up significantly. We'll try to provide her with the co-pay assistance card and samples today. She is on cholesterol medicine and recently had labs checked by her primary care provider. We'll need to get those results. She denies a significant bleeding problems on aspirin and Plavix. She's not needed any nitroglycerin. She denies any chest pain. EKG is stable.  12/13/2017  Maria Robertson returns today for follow-up.  Overall she remains asymptomatic.  She denies chest pain or worsening shortness of breath.  Blood pressures been well controlled.  EKG shows sinus rhythm at 65.  She is on dual antiplatelet therapy which we have addressed in the past.  She feels uncomfortable stopping the antiplatelet medications because she is done so well.  She continues to be overweight and has not made much impact on that however exercises regularly without any symptoms.  03/02/2019  Maria Robertson is seen today for follow-up.  She continues to do very well.  Recently she has lost some additional weight from 194 down to 182 pounds.  She is more physically active.  She denies any chest pain or worsening shortness of breath.  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  She is overdue for a lipid profile which we will repeat.  Otherwise she needs to have refills of her medications.  03/30/2022  Maria Robertson returns today for follow-up.  She is done well and is many years out from cath and PCI to the  circumflex in 2005.  She has been on dual antiplatelet therapy for that period of time.  We did discuss that more more data suggest the increased risk of bleeding on long-term dual antiplatelet therapy which is likely providing her no additional benefit.  Her lipids were reassessed back in May.  Her LDL was still little high at 81, total cholesterol 162, triglycerides 95 and HDL 64.  She says she is  exercising regularly but has had difficulty losing weight.  Blood pressure was elevated back in May.  It was elevated again today despite an increase in her nebivolol.  I repeated her blood pressure and it was 158/80.  She did tell me she had had a retinal hemorrhage and has a follow-up tomorrow with Dr. Luciana Axe to review this.  She is also inquiring about weight loss medications today.  PMHx:  Past Medical History:  Diagnosis Date   Coronary artery disease    a. 2005 MI/PCI: LCX (2.5x8 Cypher DES);  b. 2011 USA/Cath: stable anatomy & patent stent; c. 05/2014 ETT: Ex time 7 mins.  Neg study.   Dyslipidemia    Essential hypertension    GERD (gastroesophageal reflux disease)    Myocardial infarction (HCC) 2005   Obesity     Past Surgical History:  Procedure Laterality Date   CARDIAC CATHETERIZATION  2005   showed 80% stenosis in left circumflex, 2.5x68mm Taxus drug-eluting stent    FRACTURE SURGERY     right foot    FAMHx:  Family History  Problem Relation Age of Onset   Heart disease Father    Diabetes Mother    Hypertension Mother    Breast cancer Maternal Great-grandmother     SOCHx:   reports that she quit smoking about 12 years ago. Her smoking use included cigarettes. She has never used smokeless tobacco. She reports that she does not drink alcohol and does not use drugs.  ALLERGIES:  No Known Allergies  ROS: Pertinent items noted in HPI and remainder of comprehensive ROS otherwise negative.  HOME MEDS: Current Outpatient Medications  Medication Sig Dispense Refill   aspirin 81 MG tablet Take 81 mg by mouth daily.     atorvastatin (LIPITOR) 20 MG tablet TAKE 1 TABLET BY MOUTH EVERY DAY 90 tablet 3   clopidogrel (PLAVIX) 75 MG tablet TAKE 1 TABLET BY MOUTH EVERY DAY 90 tablet 3   losartan (COZAAR) 50 MG tablet Take 1 tablet (50 mg total) by mouth daily. 90 tablet 3   nebivolol (BYSTOLIC) 10 MG tablet Take 1 tablet (10 mg total) by mouth daily. 90 tablet 3    nitroGLYCERIN (NITROSTAT) 0.4 MG SL tablet Place 1 tablet (0.4 mg total) under the tongue every 5 (five) minutes as needed for chest pain. 25 tablet 3   pantoprazole (PROTONIX) 40 MG tablet TAKE 1 TABLET BY MOUTH EVERY DAY 90 tablet 3   Current Facility-Administered Medications  Medication Dose Route Frequency Provider Last Rate Last Admin   nebivolol (BYSTOLIC) tablet 10 mg  10 mg Oral Daily Azalee Course, [place]        LABS/IMAGING: No results found for this or any previous visit (from the past 48 hour(s)). No results found.  VITALS: BP (!) 166/94 (BP Location: Left Arm, Patient Position: Sitting)   Pulse 76   Ht 4\' 10"  (1.473 m)   Wt 196 lb 12.8 oz (89.3 kg)   SpO2 97%   BMI 41.13 kg/m   EXAM: General appearance: alert and no distress Neck: no carotid bruit  and no JVD Lungs: clear to auscultation bilaterally Heart: regular rate and rhythm, S1, S2 normal, no murmur, click, rub or gallop Abdomen: soft, non-tender; bowel sounds normal; no masses,  no organomegaly Extremities: extremities normal, atraumatic, no cyanosis or edema Pulses: 2+ and symmetric Skin: Skin color, texture, turgor normal. No rashes or lesions Neurologic: Grossly normal Psych: Mood, affect normal  EKG: Normal sinus rhythm at 76-personally reviewed  ASSESSMENT: Coronary artery disease status post PCI to left circumflex in 2005 Dyslipidemia GERD Obesity  PLAN: 1.   Maria Robertson apparently had a recent retinal hemorrhage.  There is probably more reason to consider stopping dual antiplatelet therapy in addition the fact that she has been on it for close to 20 years.  I advise stopping Plavix.  I doubt that she will have any anginal symptoms however if she does she should reach out to [place].  She should remain on low-dose aspirin 81 mg daily.  Cholesterol and blood pressure are above target.  This is despite an increase in nebivolol.  She has an appointment with her retinal specialist tomorrow.  We will need to likely  titrate her medicine some more.  She is interested in weight loss medications.  I do not currently prescribe these.  I will refer her to our pharmacist who have more experience with this however because it needs more titration and frequent visits.  I did offer a referral to weight management which she declined because she is often in [place] or at the beach.  Follow-up in 6 months or sooner as necessary.  Chrystie Nose, MD, [facility], FACP  [place]  [facility]  Medical Director of the Advanced Lipid Disorders &  Cardiovascular Risk Reduction Clinic Diplomate of the American Board of Clinical Lipidology Attending Cardiologist  Direct Dial: [phone]  Fax: [phone]  Website:  [email or web address] Nicely Elnora Quizon 03/30/2022, 9:05 AM

## 2022-03-31 ENCOUNTER — Encounter (INDEPENDENT_AMBULATORY_CARE_PROVIDER_SITE_OTHER): Payer: Self-pay

## 2022-03-31 ENCOUNTER — Encounter (INDEPENDENT_AMBULATORY_CARE_PROVIDER_SITE_OTHER): Payer: Medicare Other | Admitting: Ophthalmology

## 2022-04-15 ENCOUNTER — Encounter (HOSPITAL_BASED_OUTPATIENT_CLINIC_OR_DEPARTMENT_OTHER): Payer: Medicare Other | Admitting: Cardiovascular Disease

## 2022-05-01 ENCOUNTER — Other Ambulatory Visit: Payer: Self-pay | Admitting: Internal Medicine

## 2022-05-06 ENCOUNTER — Ambulatory Visit (HOSPITAL_BASED_OUTPATIENT_CLINIC_OR_DEPARTMENT_OTHER): Payer: Medicare Other | Admitting: Cardiovascular Disease

## 2022-05-08 ENCOUNTER — Ambulatory Visit: Payer: Medicare Other

## 2022-05-08 ENCOUNTER — Ambulatory Visit (HOSPITAL_BASED_OUTPATIENT_CLINIC_OR_DEPARTMENT_OTHER): Payer: Medicare Other | Attending: Adult Health | Admitting: Cardiovascular Disease

## 2022-05-08 DIAGNOSIS — G4733 Obstructive sleep apnea (adult) (pediatric): Secondary | ICD-10-CM

## 2022-05-08 DIAGNOSIS — G4734 Idiopathic sleep related nonobstructive alveolar hypoventilation: Secondary | ICD-10-CM

## 2022-05-17 ENCOUNTER — Encounter (HOSPITAL_BASED_OUTPATIENT_CLINIC_OR_DEPARTMENT_OTHER): Payer: Self-pay | Admitting: Cardiovascular Disease

## 2022-05-17 NOTE — Procedures (Signed)
     Patient Name: Maria Robertson, Maria Robertson Date: 05/09/2022 Gender: Female D.O.B: 03-16-1956 Age (years): 66 Referring Provider: Joni Reining NP Height (inches): 58 Interpreting Physician: Nicki Guadalajara MD, ABSM Weight (lbs): 195 RPSGT: [place] Sink BMI: 41 MRN: [id number] Neck Size: 13.50  CLINICAL INFORMATION Sleep Study Type: HST  Indication for sleep study: snoring; Stop-Bang 7  Epworth Sleepiness Score: 9  SLEEP STUDY TECHNIQUE A multi-channel overnight portable sleep study was performed. The channels recorded were: nasal airflow, thoracic respiratory movement, and oxygen saturation with a pulse oximetry. Snoring was also monitored.  MEDICATIONS aspirin 81 MG tablet atorvastatin (LIPITOR) 20 MG tablet clopidogrel (PLAVIX) 75 MG tablet losartan (COZAAR) 50 MG tablet nebivolol (BYSTOLIC) 10 MG tablet nitroGLYCERIN (NITROSTAT) 0.4 MG SL tablet pantoprazole (PROTONIX) 40 MG tablet Patient self administered medications include: N/A.  SLEEP ARCHITECTURE Patient was studied for 363 minutes. The sleep efficiency was 100.0 % and the patient was supine for 0%. The arousal index was 0.0 per hour.  RESPIRATORY PARAMETERS The overall AHI was 85.6 per hour, with a central apnea index of 0 per hour.  The oxygen nadir was 70% during sleep. Time spent < 89% was 69.1 minutes.  CARDIAC DATA Mean heart rate during sleep was 61.2 bpm. Heart rate range: 41 - 93 bpm.  IMPRESSIONS - Severe obstructive sleep apnea occurred during this study (AHI 85.6/h). - Severe oxygen desaturation to a nadir of 70%. - Patient snored 142.5 minutes (39.3%) during the sleep.  DIAGNOSIS - Obstructive Sleep Apnea (G47.33) - Nocturnal Hypoxemia (G47.36)  RECOMMENDATIONS - In this patient with cardiovascular co-morbidities, very severe sleep apnea and severe oxygen desaturation recommend an in-lab CPAP with possible BiPAP titration study. If unable to obtain an in-lab evaluation, initiate  Auto-PAP with EPR of 3 at 7 - 20 cm of water.  - Effort should be made to optimize nasal and oropharyngeal patency. - Positional therapy avoiding supine position during sleep. - Avoid alcohol, sedatives and other CNS depressants that may worsen sleep apnea and disrupt normal sleep architecture. - Sleep hygiene should be reviewed to assess factors that may improve sleep quality. - Weight management (BMI 41) and regular exercise should be initiated or continued. - Recommend a download and sleep clinic evaluation after 4 weeks of therapy.    [Electronically signed] 05/17/2022 04:30 PM  Nicki Guadalajara MD, [facility], ABSM Diplomate, American Board of Sleep Medicine  NPI: [id number]  [facility] PH: [phone]   FX: [id number] ACCREDITED BY THE AMERICAN ACADEMY OF SLEEP MEDICINE

## 2022-05-22 ENCOUNTER — Telehealth: Payer: Self-pay | Admitting: *Deleted

## 2022-05-22 ENCOUNTER — Other Ambulatory Visit: Payer: Self-pay | Admitting: Cardiovascular Disease

## 2022-05-22 DIAGNOSIS — G4733 Obstructive sleep apnea (adult) (pediatric): Secondary | ICD-10-CM

## 2022-05-22 DIAGNOSIS — G4736 Sleep related hypoventilation in conditions classified elsewhere: Secondary | ICD-10-CM

## 2022-05-22 NOTE — Telephone Encounter (Signed)
-----   Message from Troy Sine, MD sent at 05/17/2022  4:35 PM EST ----- Mariann Laster, please notify pt and schedule an in-lab CPAP/BiPAP titration study

## 2022-05-22 NOTE — Telephone Encounter (Signed)
Patient notified of sleep study results and recommendations. She agrees to proceed with CPAP titration study. 

## 2022-06-10 ENCOUNTER — Ambulatory Visit (HOSPITAL_BASED_OUTPATIENT_CLINIC_OR_DEPARTMENT_OTHER): Payer: Medicare Other | Attending: Cardiovascular Disease | Admitting: Cardiovascular Disease

## 2022-06-10 ENCOUNTER — Encounter (HOSPITAL_BASED_OUTPATIENT_CLINIC_OR_DEPARTMENT_OTHER): Payer: Medicare Other | Admitting: Cardiovascular Disease

## 2022-06-10 VITALS — Ht 59.0 in | Wt 195.0 lb

## 2022-06-10 DIAGNOSIS — R0683 Snoring: Secondary | ICD-10-CM | POA: Insufficient documentation

## 2022-06-10 DIAGNOSIS — G4736 Sleep related hypoventilation in conditions classified elsewhere: Secondary | ICD-10-CM

## 2022-06-10 DIAGNOSIS — G4733 Obstructive sleep apnea (adult) (pediatric): Secondary | ICD-10-CM | POA: Diagnosis not present

## 2022-06-18 ENCOUNTER — Encounter (HOSPITAL_BASED_OUTPATIENT_CLINIC_OR_DEPARTMENT_OTHER): Payer: Self-pay | Admitting: Cardiovascular Disease

## 2022-06-18 ENCOUNTER — Other Ambulatory Visit: Payer: Self-pay | Admitting: Internal Medicine

## 2022-06-18 DIAGNOSIS — Z1231 Encounter for screening mammogram for malignant neoplasm of breast: Secondary | ICD-10-CM

## 2022-06-18 NOTE — Procedures (Signed)
Patient Name: Maria Robertson, Maria Robertson Date: 06/10/2022 Gender: Female D.O.B: 03-16-1956 Age (years): 66 Referring Provider: Jory Sims NP Height (inches): 59 Interpreting Physician: Shelva Majestic MD, ABSM Weight (lbs): 195 RPSGT: Carolin Coy BMI: 39 MRN: [id number] Neck Size: 16.50  CLINICAL INFORMATION The patient is referred for a BiPAP titration to treat sleep apnea.  Date of HST: 05/08/2022:  AHI 85/h; O2 nadir 70%.  SLEEP STUDY TECHNIQUE As per the AASM Manual for the Scoring of Sleep and Associated Events v2.3 (April 2016) with a hypopnea requiring 4% desaturations.  The channels recorded and monitored were frontal, central and occipital EEG, electrooculogram (EOG), submentalis EMG (chin), nasal and oral airflow, thoracic and abdominal wall motion, anterior tibialis EMG, snore microphone, electrocardiogram, and pulse oximetry. Bilevel positive airway pressure (BPAP) was initiated at the beginning of the study and titrated to treat sleep-disordered breathing.  MEDICATIONS aspirin 81 MG tablet atorvastatin (LIPITOR) 20 MG tablet clopidogrel (PLAVIX) 75 MG tablet losartan (COZAAR) 50 MG table nebivolol (BYSTOLIC) 10 MG tablet nitroGLYCERIN (NITROSTAT) 0.4 MG SL tablet pantoprazole (PROTONIX) 40 MG tablet Medications self-administered by patient taken the night of the study : N/A  RESPIRATORY PARAMETERS Optimal IPAP Pressure (cm): 29 AHI at Optimal Pressure (/hr) 0 Optimal EPAP Pressure (cm): 25   Overall Minimal O2 (%): 74.00 Minimal O2 at Optimal Pressure (%): 94.0  SLEEP ARCHITECTURE Start Time: 10:46:29 PM Stop Time: 5:10:08 AM Total Time (min): 383.6 Total Sleep Time (min): 340.5 Sleep Latency (min): 12.4 Sleep Efficiency (%): 88.8 REM Latency (min): 89.0 WASO (min): 30.7 Stage N1 (%): 8.08 Stage N2 (%): 69.75 Stage N3 (%): 0.00 Stage R (%): 22.2 Supine (%): 83.85 Arousal Index (/hr): 28.7   CARDIAC DATA The 2 lead EKG demonstrated sinus  rhythm. The mean heart rate was 61.11 beats per minute. Other EKG findings include: periods of tachcardia  LEG MOVEMENT DATA The total Periodic Limb Movements of Sleep (PLMS) were 0. The PLMS index was 0.00. A PLMS index of <15 is considered normal in adults.  IMPRESSIONS - CPAP was initiated at 7 cm was titrated to 20 cm, and transitioned to BiPAP 24/20 ( AHI 32.1; RDI 64.3/h; O2 nadir 92%) and titrated to optimal pressure at 29/25 cm ( AHI 0; O2 nadir 94%.  - Central sleep apnea was not noted during this titration (CAI  0.4/h). - Severe oxygen desaturations were observed during this titrationto a nadir of 74% at 14 cm of water. - The patient snored with loud snoring volume. - Mild cardiac abnormalities were observed during this study with short burst of sinus tachycardia or possible atrial tachycardia. - Clinically significant periodic limb movements were not noted during this study. Arousals associated with PLMs were rare.  DIAGNOSIS - Obstructive Sleep Apnea (G47.33)  RECOMMENDATIONS - If a BiPAP machine is available up to 30 cm recommend an initial trial of BiPAP therapy at 29/25 cm H2O.  If the maximal available pressure is 25 cm, then initiate therapy at 25/21 cm of water with heated humidification. An Extra Small size [employer] Full Face Evora Full mask was used for the titration. - Effort should be made to optimize nasal and oropharyngeal patency. - Avoid alcohol, sedatives and other CNS depressants that may worsen sleep apnea and disrupt normal sleep architecture. - Sleep hygiene should be reviewed to assess factors that may improve sleep quality. - Weight management (BMI 39) and regular exercise should be initiated or continued. - Recommend a download and sleep clinic evaluation after 4 weeks of  therapy  [Electronically signed] 06/18/2022 05:34 PM  Shelva Majestic MD, [facility], ABSM Diplomate, American Board of Sleep Medicine  NPI: [id number]  [place] PH: [phone]   FX: [phone] [place]

## 2022-06-19 ENCOUNTER — Other Ambulatory Visit: Payer: Self-pay | Admitting: Adult Health

## 2022-06-26 ENCOUNTER — Telehealth: Payer: Self-pay | Admitting: Cardiovascular Disease

## 2022-06-26 NOTE — Telephone Encounter (Signed)
Patient is requesting a call back to discuss second part of sleep study results.

## 2022-06-26 NOTE — Telephone Encounter (Signed)
Returned a call to the patient and informed her of what the next steps are as far as her getting her BIPAP machine. She voiced understanding and all of her questions has been answered. BIPAP order submitted to [facility] via [facility] portal.

## 2022-06-26 NOTE — Telephone Encounter (Signed)
-----   Message from Troy Sine, MD sent at 06/18/2022  5:41 PM EST ----- Mariann Laster please notify patient of the results.  Most BiPAP machines only go to 25/21.  However if there is a machine that can go up to 30 she was titrated up to 29/25 for optimal pressure.  Otherwise if we do not have a maximum pressure machine available set up with 25/21.

## 2022-07-15 ENCOUNTER — Ambulatory Visit
Admission: RE | Admit: 2022-07-15 | Discharge: 2022-07-15 | Disposition: A | Discharge status: Home / Self Care | Payer: Medicare Other | Source: Ambulatory Visit | Attending: Internal Medicine | Admitting: Internal Medicine

## 2022-07-15 DIAGNOSIS — Z1231 Encounter for screening mammogram for malignant neoplasm of breast: Secondary | ICD-10-CM

## 2022-08-11 ENCOUNTER — Telehealth: Payer: Self-pay | Admitting: *Deleted

## 2022-08-11 NOTE — Telephone Encounter (Signed)
Message left on voice mail Dr Claiborne Billings has decreased the pressure on her BIPAP machine. She is to start using the device tonight. If she has any further concerns please call us back.

## 2022-09-24 ENCOUNTER — Other Ambulatory Visit: Payer: Self-pay | Admitting: Internal Medicine

## 2022-09-24 ENCOUNTER — Ambulatory Visit: Payer: Medicare Other | Attending: Internal Medicine | Admitting: Pharmacist

## 2022-09-24 VITALS — Wt 164.0 lb

## 2022-09-24 DIAGNOSIS — I2510 Atherosclerotic heart disease of native coronary artery without angina pectoris: Secondary | ICD-10-CM

## 2022-09-24 DIAGNOSIS — I2583 Coronary atherosclerosis due to lipid rich plaque: Secondary | ICD-10-CM | POA: Diagnosis present

## 2022-09-24 MED ORDER — WEGOVY 1 MG/0.5ML ~~LOC~~ SOAJ: 1.0000 mg | SUBCUTANEOUS | 0 refills | Status: DC

## 2022-09-24 NOTE — Progress Notes (Signed)
Patient ID: Maria Robertson                 DOB: 1956/03/28                    MRN: [id number]     HPI: Maria Robertson is a 67 y.o. female patient referred to pharmacy clinic by Dr. Rennis Golden to initiate weight loss therapy with GLP1-RA. PMH is significant for obesity, CAD s/p MI with PIC, HLD, HTN and GERD. Most recent BMI 33.  Patient presents today to Pharm.D. clinic.  She admits that she has been taking compounded tirzepatide.  She has lost 30 pounds since her last visit.  She is also started exercising and eating better.  She takes an exercise class, burn [employer], 3 days a week and also does a lot of walking and some chair exercise classes.  She eats high-protein diet with plenty of fruits and vegetables..  She pays about $400 a month for her compounded medication and would like to see if she can get it covered under Medicare now that they approved Wegovy for prevention of CAD.  Diet: high protein, lots of fruits and vegetables Water, un-sweet tea, coffee, V8 juice  Exercise: walking as much as she can, burn boot camp, something similar to chair yoga  Family History:  Family History  Problem Relation Age of Onset   Heart disease Father    Diabetes Mother    Hypertension Mother    Breast cancer Maternal Great-grandmother     Social History:  Social History   Socioeconomic History   Marital status: Widowed    Spouse name: Not on file   Number of children: 5   Years of education: Not on file   Highest education level: Not on file  Occupational History   Not on file  Tobacco Use   Smoking status: Former    Years: 20    Types: Cigarettes    Quit date: 01/16/2010    Years since quitting: 12.6   Smokeless tobacco: Never  Substance and Sexual Activity   Alcohol use: No   Drug use: No   Sexual activity: Not on file  Other Topics Concern   Not on file  Social History Narrative   Not on file   Social Determinants of Health   Financial Resource Strain: Not on file  Food  Insecurity: Not on file  Transportation Needs: Not on file  Physical Activity: Not on file  Stress: Not on file  Social Connections: Not on file  Intimate Partner Violence: Not on file     Labs: No results found for: "HGBA1C"  Wt Readings from Last 1 Encounters:  06/10/22 195 lb (88.5 kg)    BP Readings from Last 1 Encounters:  03/30/22 (!) 166/94   Pulse Readings from Last 1 Encounters:  03/30/22 76       Component Value Date/Time   CHOL 162 09/26/2021 1021   CHOL 142 03/30/2013 1151   TRIG 95 09/26/2021 1021   TRIG 82 03/30/2013 1151   HDL 64 09/26/2021 1021   HDL 66 03/30/2013 1151   CHOLHDL 2.5 09/26/2021 1021   CHOLHDL 2.1 09/16/2016 1113   VLDL 15 09/16/2016 1113   LDLCALC 81 09/26/2021 1021   LDLCALC 60 03/30/2013 1151    Past Medical History:  Diagnosis Date   Coronary artery disease    a. 2005 MI/PCI: LCX (2.5x8 Cypher DES);  b. 2011 [place]/Cath: stable anatomy & patent stent; c. 05/2014  ETT: Ex time 7 mins.  Neg study.   Dyslipidemia    Essential hypertension    GERD (gastroesophageal reflux disease)    Myocardial infarction [facility]) 2005   Obesity     Current Outpatient Medications on File Prior to Visit  Medication Sig Dispense Refill   aspirin 81 MG tablet Take 81 mg by mouth daily.     atorvastatin (LIPITOR) 20 MG tablet TAKE 1 TABLET BY MOUTH EVERY DAY 90 tablet 3   clopidogrel (PLAVIX) 75 MG tablet TAKE 1 TABLET BY MOUTH EVERY DAY 90 tablet 2   losartan (COZAAR) 50 MG tablet TAKE 1 TABLET BY MOUTH EVERY DAY 90 tablet 3   nebivolol (BYSTOLIC) 10 MG tablet Take 1 tablet (10 mg total) by mouth daily. 90 tablet 3   nitroGLYCERIN (NITROSTAT) 0.4 MG SL tablet Place 1 tablet (0.4 mg total) under the tongue every 5 (five) minutes as needed for chest pain. 25 tablet 3   pantoprazole (PROTONIX) 40 MG tablet TAKE 1 TABLET BY MOUTH EVERY DAY 90 tablet 2   Current Facility-Administered Medications on File Prior to Visit  Medication Dose Route Frequency Provider  Last Rate Last Admin   nebivolol (BYSTOLIC) tablet 10 mg  10 mg Oral Daily [place], [place], [place]        No Known Allergies   Assessment/Plan:  1. Weight loss - Patient is doing very well on the third dose of compounded tirzepatide.  She has not had any side effects and has lost about 30 pounds.  She would like to see if she can get her insurance company to pay for GLP-1.  She does have Medicare but does also have CAD.  Prior authorization submitted and approved through her [employer] plan.  We talked about the potential cost and both Medicare coverage gap and catastrophic.  I will start her on Wegovy 1 mg weekly and will increase as tolerated.  Patient encouraged to continue her exercise and diet changes.  Advised patient on common side effects including nausea, diarrhea, dyspepsia, decreased appetite, and fatigue. Counseled patient on reducing meal size and how to titrate medication to minimize side effects. Counseled patient to call if intolerable side effects or if experiencing dehydration, abdominal pain, or dizziness. Patient will adhere to dietary modifications and will target at least 150 minutes of moderate intensity exercise weekly.   Titration Plan:   Patient has about 2 weeks of tirzepatide left. She will start [facility] after that.  Will plan to follow the titration plan as below, pending patient is tolerating each dose before increasing to the next. Can slow titration if needed for tolerability.    -Month 1: Inject 1mg  SQ once weekly x 4 weeks -Month 2: Inject 1.7mg  SQ once weekly x 4 weeks -Month 3: Inject 2.4mg  SQ once weekly   Follow up in 5 weeks over the phone.

## 2022-09-24 NOTE — Patient Instructions (Signed)
GLP-1 Receptor Agonist Counseling Points This medication reduces your appetite and may make you feel fuller longer.  Stop eating when your body tells you that you are full. This will likely happen sooner than you are used to. Fried/greasy food and sweets may upset your stomach - minimize these as much as possible. Store your medication in the fridge until you are ready to use it. Inject your medication in the fatty tissue of your lower abdominal area (2 inches away from belly button) or upper outer thigh. Rotate injection sites. Common side effects include: nausea, diarrhea/constipation, and heartburn, and are more likely to occur if you overeat. Stop your injection for 7 days prior to surgical procedures requiring anesthesia.  Dosing schedule: - Month 1: Inject 1mg  subcutaneously once weekly for 4 weeks - Month 2: Inject 1.7mg  subcutaneously once weekly for 4 weeks - Month 3: Inject 2.5 subcutaneously once weekly   Tips for success: Write down the reasons why you want to lose weight and post it in a place where you'll see it often.  Start small and work your way up. Keep in mind that it takes time to achieve goals, and small steps add up.  Any additional movements help to burn calories. Taking the stairs rather than the elevator and parking at the far end of your parking lot are easy ways to start. Brisk walking for at least 30 minutes 4 or more days of the week is an excellent goal to work toward  Understanding what it means to feel full: Did you know that it can take 15 minutes or more for your brain to receive the message that you've eaten? That means that, if you eat less food, but consume it slower, you may still feel satisfied.  Eating a lot of fruits and vegetables can also help you feel fuller.  Eat off of smaller plates so that moderate portions don't seem too small  Tips for living a healthier life     Building a Healthy and Balanced Diet Make most of your meal vegetables  and fruits -  of your plate. Aim for color and variety, and remember that potatoes don't count as vegetables on the Healthy Eating Plate because of their negative impact on blood sugar.  Go for whole grains -  of your plate. Whole and intact grains--whole wheat, barley, wheat berries, quinoa, oats, brown rice, and foods made with them, such as whole wheat pasta--have a milder effect on blood sugar and insulin than white bread, white rice, and other refined grains.  Protein power -  of your plate. Fish, poultry, beans, and nuts are all healthy, versatile protein sources--they can be mixed into salads, and pair well with vegetables on a plate. Limit red meat, and avoid processed meats such as bacon and sausage.  Healthy plant oils - in moderation. Choose healthy vegetable oils like olive, canola, soy, corn, sunflower, peanut, and others, and avoid partially hydrogenated oils, which contain unhealthy trans fats. Remember that low-fat does not mean "healthy."  Drink water, coffee, or tea. Skip sugary drinks, limit milk and dairy products to one to two servings per day, and limit juice to a small glass per day.  Stay active. The red figure running across the Healthy Eating Plate's placemat is a reminder that staying active is also important in weight control.  The main message of the Healthy Eating Plate is to focus on diet quality:  The type of carbohydrate in the diet is more important than the amount of  carbohydrate in the diet, because some sources of carbohydrate--like vegetables (other than potatoes), fruits, whole grains, and beans--are healthier than others. The Healthy Eating Plate also advises consumers to avoid sugary beverages, a major source of calories--usually with little nutritional value--in the American diet. The Healthy Eating Plate encourages consumers to use healthy oils, and it does not set a maximum on the percentage of calories people should get each day from healthy  sources of fat. In this way, the Healthy Eating Plate recommends the opposite of the low-fat message promoted for decades by the USDA.  [employer]  SUGAR  Sugar is a huge problem in the modern day diet. Sugar is a big contributor to heart disease, diabetes, high triglyceride levels, fatty liver disease and obesity. Sugar is hidden in almost all packaged foods/beverages. Added sugar is extra sugar that is added beyond what is naturally found and has no nutritional benefit for your body. The American Heart Association recommends limiting added sugars to no more than 25g for women and 36 grams for men per day. There are many names for sugar including maltose, sucrose (names ending in "ose"), high fructose corn syrup, molasses, cane sugar, corn sweetener, raw sugar, syrup, honey or fruit juice concentrate.   One of the best ways to limit your added sugars is to stop drinking sweetened beverages such as soda, sweet tea, and fruit juice.  There is 65g of added sugars in one 20oz bottle of Coke! That is equal to 7.5 donuts.   Pay attention and read all nutrition facts labels. Below is an examples of a nutrition facts label. The #1 is showing you the total sugars where the # 2 is showing you the added sugars. This one serving has almost the max amount of added sugars per day!   EXERCISE  Exercise is good. We've all heard that. In an ideal world, we would all have time and resources to get plenty of it. When you are active, your heart pumps more efficiently and you will feel better.  Multiple studies show that even walking regularly has benefits that include living a longer life. The American Heart Association recommends 150 minutes per week of exercise (30 minutes per day most days of the week). You can do this in any increment you wish. Nine or more 10-minute walks count. So does an hour-long exercise class. Break the time apart into what will work in  your life. Some of the best things you can do include walking briskly, jogging, cycling or swimming laps. Not everyone is ready to "exercise." Sometimes we need to start with just getting active. Here are some easy ways to be more active throughout the day:  Take the stairs instead of the elevator  Go for a 10-15 minute walk during your lunch break (find a friend to make it more enjoyable)  When shopping, park at the back of the parking lot  If you take public transportation, get off one stop early and walk the extra distance  Pace around while making phone calls  Check with your doctor if you aren't sure what your limitations may be. Always remember to drink plenty of water when doing any type of exercise. Don't feel like a failure if you're not getting the 90-150 minutes per week. If you started by being a couch potato, then just a 10-minute walk each day is a huge improvement. Start with little victories and work your way up.   HEALTHY EATING TIPS  Plan ahead: make a menu of the meals for a week then create a grocery list to go with that menu. Consider meals that easily stretch into a night of leftovers, such as stews or casseroles. Or consider making two of your favorite meal and put one in the freezer for another night. Try a night or two each week that is "meatless" or "no cook" such as salads. When you get home from the grocery store wash and prepare your vegetables and fruits. Then when you need them they are ready to go.   Tips for going to the grocery store:  Buy store or generic brands  Check the weekly ad from your store on-line or in their in-store flyer  Look at the unit price on the shelf tag to compare/contrast the costs of different items  Buy fruits/vegetables in season  Carrots, bananas and apples are low-cost, naturally healthy items  If meats or frozen vegetables are on sale, buy some extras and put in your freezer  Limit buying prepared or "ready to eat" items,  even if they are pre-made salads or fruit snacks  Do not shop when you're hungry  Foods at eye level tend to be more expensive. Look on the high and low shelves for deals.  Consider shopping at the farmer's market for fresh foods in season.  Avoid the cookie and chip aisles (these are expensive, high in calories and low in nutritional value). Shop on the outside of the grocery store.  Healthy food preparations:  If you can't get lean hamburger, be sure to drain the fat when cooking  Steam, saut (in olive oil), grill or bake foods  Experiment with different seasonings to avoid adding salt to your foods. Kosher salt, sea salt and Himalayan salt are all still salt and should be avoided. Try seasoning food with onion, garlic, thyme, rosemary, basil ect. Onion powder or garlic powder is ok. Avoid if it says salt (ie garlic salt).

## 2022-10-25 ENCOUNTER — Other Ambulatory Visit: Payer: Self-pay | Admitting: Adult Health

## 2022-10-30 ENCOUNTER — Telehealth: Payer: Self-pay | Admitting: Pharmacist

## 2022-10-30 DIAGNOSIS — E785 Hyperlipidemia, unspecified: Secondary | ICD-10-CM

## 2022-10-30 DIAGNOSIS — I2510 Atherosclerotic heart disease of native coronary artery without angina pectoris: Secondary | ICD-10-CM

## 2022-10-30 NOTE — Telephone Encounter (Signed)
Called pt to see how she was doing on Wegovy 1mg  and see if she was interested in increasing to 1.7mg . LVM for pt to call back.  Will also f/u to see how her exercising and diet is going.

## 2022-10-30 NOTE — Telephone Encounter (Signed)
I spoke with patient. She states that she went to pick up the [facility] but it was $900. She still has some compounded tirzepitide left. Has lost 43lb. She is still working out, doing [employer] camp and working on her diet. She would like to repeat some blood work since she lost weight. I have ordered a lipid panel and CMP.  A1C previously normal. [facility] will [facility] at [facility]

## 2022-11-13 LAB — COMPREHENSIVE METABOLIC PANEL
ALT: 13 IU/L (ref 0–32)
AST: 15 IU/L (ref 0–40)
Albumin: 4.5 g/dL (ref 3.9–4.9)
Alkaline Phosphatase: 92 IU/L (ref 44–121)
BUN/Creatinine Ratio: 17 (ref 12–28)
BUN: 14 mg/dL (ref 8–27)
Bilirubin Total: 1.9 mg/dL — ABNORMAL HIGH (ref 0.0–1.2)
CO2: 24 mmol/L (ref 20–29)
Calcium: 9.1 mg/dL (ref 8.7–10.3)
Chloride: 102 mmol/L (ref 96–106)
Creatinine, Ser: 0.81 mg/dL (ref 0.57–1.00)
Globulin, Total: 2.3 g/dL (ref 1.5–4.5)
Glucose: 91 mg/dL (ref 70–99)
Potassium: 3.5 mmol/L (ref 3.5–5.2)
Sodium: 142 mmol/L (ref 134–144)
Total Protein: 6.8 g/dL (ref 6.0–8.5)
eGFR: 80 mL/min/{1.73_m2} (ref >59)

## 2022-11-13 LAB — LIPID PANEL
Chol/HDL Ratio: 2.7 ratio (ref 0.0–4.4)
Cholesterol, Total: 145 mg/dL (ref 100–199)
HDL: 53 mg/dL (ref >39)
LDL Chol Calc (NIH): 73 mg/dL (ref 0–99)
Triglycerides: 103 mg/dL (ref 0–149)
VLDL Cholesterol Cal: 19 mg/dL (ref 5–40)

## 2022-11-16 ENCOUNTER — Other Ambulatory Visit: Payer: Self-pay | Admitting: Pharmacist

## 2022-11-16 ENCOUNTER — Telehealth: Payer: Self-pay | Admitting: Pharmacist

## 2022-11-16 DIAGNOSIS — I2510 Atherosclerotic heart disease of native coronary artery without angina pectoris: Secondary | ICD-10-CM

## 2022-11-16 DIAGNOSIS — E785 Hyperlipidemia, unspecified: Secondary | ICD-10-CM

## 2022-11-16 MED ORDER — EZETIMIBE 10 MG PO TABS: 10.0000 mg | ORAL_TABLET | Freq: Every day | ORAL | 3 refills | Status: DC

## 2022-11-16 NOTE — Telephone Encounter (Signed)
Patient called in response to lab result notes. LDL-C improved, but she has premature CAD and LDL-C should be <55. She does not want to increase atorvastatin, but is willing to add zetia 10mg  daily. She will get labs in [place] in 2-3 months.

## 2023-01-23 ENCOUNTER — Other Ambulatory Visit: Payer: Self-pay | Admitting: Internal Medicine

## 2023-01-25 ENCOUNTER — Other Ambulatory Visit: Payer: Self-pay | Admitting: Internal Medicine

## 2023-02-12 ENCOUNTER — Telehealth: Payer: Self-pay | Admitting: Pharmacist

## 2023-02-12 NOTE — Telephone Encounter (Signed)
Pt called in stating BP has been low. 96/64, 96/76, 89/62, being [facility] at [facility] and at home. Feeling poorly and dizzy. States she weighs 129 lbs now. Last weight was 164 when she saw Melissa in May, and 196 when she saw Dr Rennis Golden last November. She has Wegovy 1mg  on her med list but pt states she is not taking this due to cost. Reports she's taking compounded tirzepatide.   I will stop her losartan 50mg  daily, advised pt her BP has decreased most likely due to drastic weight loss. She will continue to monitor her BP at home. I have scheduled her for BP follow up with Melissa in a week. May need dose reduction of Bystolic as well if BP stays low.

## 2023-02-18 NOTE — Progress Notes (Signed)
Patient ID: [name]                 DOB: [birth year]                      MRN: [id number]     HPI: Maria Robertson is a 67 y.o. female patient of Dr Rennis Golden who presents today for follow up. PMH is significant for CAD s/p MI with PCI in 2005, HTN, HLD, obesity, OSA, and GERD. Previously followed by Efraim Kaufmann for GLP initiation and lipids. Pt called into clinic on 02/12/23 reporting low BP readings in the 90s/60s and feeling poorly. Has lost almost 70 lbs since starting GLP-1 RA therapy. I stopped her losartan 50mg  daily and she presents today for follow up.  Home BP readings had been running in the 80s-90s/60s recently, was feeling very poorly. She has been feeling much better since stopping losartan a week ago. Systolic BP now in the low 100s. Reports med adherence. Has still been on compounded tirzepatide 10mg  weekly but reports the pharmacy she was using will no longer be able to get. Discussed our office has not been prescribing compounded tirzepatide per FDA recommendations due to side effects/dosing errors. Reginal Lutes was ~$900 through her insurance. Feels like tirzepatide has been improving her inflammation as well.  03/2022 MD appt: 196 lbs (baseline - pre GLP therapy) 09/2022 PharmD appt: 164 lbs (on compounded tirzepatide) Today: 129 lbs (on compounded tirzepatide) - 35% weight loss from baseline  Family History: Father with heart disease, mother with HTN and DM.  Social History: Former tobacco use, quit in 2011, no alcohol or drug use.  Diet: High protein diet, fruits and vegetables  Exercise: [employer] 3 days a week, walking, chair exercise classes  Labs: 11/12/22: TC 145, TG 103, HDL 53, LDL 73 - atorvastatin 20mg  daily  Wt Readings from Last 3 Encounters:  09/24/22 164 lb (74.4 kg)  06/10/22 195 lb (88.5 kg)  05/06/22 195 lb (88.5 kg)   BP Readings from Last 3 Encounters:  03/30/22 (!) 166/94  09/26/21 (!) 162/94  03/08/20 126/86   Pulse Readings from Last 3  Encounters:  03/30/22 76  09/26/21 61  03/02/19 74    Renal function: CrCl cannot be calculated (Patient's most recent lab result is older than the maximum 21 days allowed.).  Past Medical History:  Diagnosis Date   Coronary artery disease    a. 2005 MI/PCI: LCX (2.5x8 Cypher DES);  b. 2011 [place]/Cath: stable anatomy & patent stent; c. 05/2014 ETT: Ex time 7 mins.  Neg study.   Dyslipidemia    Essential hypertension    GERD (gastroesophageal reflux disease)    Myocardial infarction [facility]) 2005   Obesity     Current Outpatient Medications on File Prior to Visit  Medication Sig Dispense Refill   aspirin 81 MG tablet Take 81 mg by mouth daily.     atorvastatin (LIPITOR) 20 MG tablet TAKE 1 TABLET BY MOUTH EVERY DAY 90 tablet 3   clopidogrel (PLAVIX) 75 MG tablet TAKE 1 TABLET BY MOUTH EVERY DAY 90 tablet 2   ezetimibe (ZETIA) 10 MG tablet Take 1 tablet (10 mg total) by mouth daily. 90 tablet 3   nebivolol (BYSTOLIC) 10 MG tablet TAKE 1 TABLET BY MOUTH EVERY DAY 90 tablet 3   nitroGLYCERIN (NITROSTAT) 0.4 MG SL tablet Place 1 tablet (0.4 mg total) under the tongue every 5 (five) minutes as needed for chest pain. 25 tablet 3  pantoprazole (PROTONIX) 40 MG tablet TAKE 1 TABLET BY MOUTH EVERY DAY 90 tablet 2   No current facility-administered medications on file prior to visit.    No Known Allergies   Assessment/Plan:  1. Obesity - Pt has lost 35% of her body weight since starting tirzepatide - 196 lbs down to 129 lbs today (BMI 41 down to 26). She had been using compounded tirzepatide 10mg  which she can no longer get (our office was not prescribing - discussed FDA recommendation against compounded GLPs due to risk of side effects/dosing errors). Will send in rx for tirzepatide 5mg  SQ weekly to [employer] pay program ($549/month) since her insurance did not cover GLPs at a cheaper rate [facility] was $900). She will keep up strength training and heart healthy diet.  2.  Hypertension - Pt hypotensive and feeling poorly, discussed she does not require as much BP medication with her drastic weight loss. Stopped losartan 50mg  daily a week ago and her systolic has improved from 80-90 to low 100s. Will decrease her Bystolic from 10mg  to 5mg  daily. Advised her to let us know if systolic remains < 110 and can further reduce to 2.5mg  daily.  3. Hyperlipidemia - LDL goal < 55 due to premature CAD. LDL previously 73 on atorvastatin 20mg  daily. Was started on ezetimibe 10mg  daily a few months ago (she preferred this over increasing her statin dose). Will recheck updated labs today. Anticipate will be well controlled with combination of her weight loss and ezetimibe addition.  F/u with PharmD as needed.  Alexandrya Chim E. Tiasha Helvie, PharmD, BCACP, CPP [facility] [address], [place], [place] [address] Phone: [phone]; Fax: [phone] 02/19/2023 8:50 AM

## 2023-02-19 ENCOUNTER — Ambulatory Visit: Payer: Medicare Other | Attending: Cardiology | Admitting: Pharmacist

## 2023-02-19 VITALS — BP 106/70 | HR 75 | Wt 129.0 lb

## 2023-02-19 DIAGNOSIS — I2583 Coronary atherosclerosis due to lipid rich plaque: Secondary | ICD-10-CM | POA: Diagnosis present

## 2023-02-19 DIAGNOSIS — E669 Obesity, unspecified: Secondary | ICD-10-CM | POA: Diagnosis present

## 2023-02-19 DIAGNOSIS — I2510 Atherosclerotic heart disease of native coronary artery without angina pectoris: Secondary | ICD-10-CM | POA: Diagnosis not present

## 2023-02-19 DIAGNOSIS — E785 Hyperlipidemia, unspecified: Secondary | ICD-10-CM | POA: Diagnosis present

## 2023-02-19 MED ORDER — NEBIVOLOL HCL 5 MG PO TABS: 5.0000 mg | ORAL_TABLET | Freq: Every day | ORAL | 3 refills | Status: DC

## 2023-02-19 MED ORDER — ZEPBOUND 5 MG/0.5ML ~~LOC~~ SOLN: 5.0000 mg | SUBCUTANEOUS | 11 refills | Status: AC

## 2023-02-19 NOTE — Patient Instructions (Signed)
Decrease your Bystolic to 5mg  daily, let us know if your systolic blood pressure stays under 110 and we can further reduce the dose  Let us know if you haven't heard anything from [employer] about your Zepbound in a week or so  We'll check your cholesterol today since you've been taking the atorvastatin and ezetimibe

## 2023-02-20 LAB — LIPID PANEL
Chol/HDL Ratio: 2.4 {ratio} (ref 0.0–4.4)
Cholesterol, Total: 117 mg/dL (ref 100–199)
HDL: 49 mg/dL (ref >39)
LDL Chol Calc (NIH): 48 mg/dL (ref 0–99)
Triglycerides: 110 mg/dL (ref 0–149)
VLDL Cholesterol Cal: 20 mg/dL (ref 5–40)

## 2023-02-20 LAB — HEPATIC FUNCTION PANEL
ALT: 13 [IU]/L (ref 0–32)
AST: 17 [IU]/L (ref 0–40)
Albumin: 4.5 g/dL (ref 3.9–4.9)
Alkaline Phosphatase: 84 [IU]/L (ref 44–121)
Bilirubin Total: 0.9 mg/dL (ref 0.0–1.2)
Bilirubin, Direct: 0.23 mg/dL (ref 0.00–0.40)
Total Protein: 6.8 g/dL (ref 6.0–8.5)

## 2023-04-21 ENCOUNTER — Other Ambulatory Visit: Payer: Self-pay | Admitting: Internal Medicine

## 2023-08-10 LAB — COLOGUARD: COLOGUARD: NEGATIVE

## 2023-10-21 ENCOUNTER — Other Ambulatory Visit: Payer: Self-pay | Admitting: Internal Medicine

## 2024-01-22 ENCOUNTER — Other Ambulatory Visit: Payer: Self-pay | Admitting: Internal Medicine
